# Patient Record
Sex: Male | Born: 1965 | Race: White | Hispanic: No | Marital: Single | Smoking: Never smoker
Health system: Southern US, Community
[De-identification: ages and names within clinical notes are randomized; demographics above are authoritative.]

## PROBLEM LIST (undated history)

## (undated) DIAGNOSIS — R7989 Other specified abnormal findings of blood chemistry: Secondary | ICD-10-CM

## (undated) HISTORY — DX: Other specified abnormal findings of blood chemistry: R79.89

## (undated) HISTORY — PX: HERNIA REPAIR: SHX51

---

## 2011-06-13 ENCOUNTER — Ambulatory Visit (INDEPENDENT_AMBULATORY_CARE_PROVIDER_SITE_OTHER): Payer: 59

## 2011-06-13 DIAGNOSIS — J111 Influenza due to unidentified influenza virus with other respiratory manifestations: Secondary | ICD-10-CM

## 2011-06-13 DIAGNOSIS — J209 Acute bronchitis, unspecified: Secondary | ICD-10-CM

## 2012-12-09 ENCOUNTER — Emergency Department (HOSPITAL_COMMUNITY): Payer: PRIVATE HEALTH INSURANCE

## 2012-12-09 ENCOUNTER — Encounter (HOSPITAL_COMMUNITY): Payer: Self-pay | Admitting: Emergency Medicine

## 2012-12-09 ENCOUNTER — Emergency Department (HOSPITAL_COMMUNITY)
Admission: EM | Admit: 2012-12-09 | Discharge: 2012-12-09 | Disposition: A | Discharge status: Home / Self Care | Payer: PRIVATE HEALTH INSURANCE | Attending: Emergency Medicine | Admitting: Emergency Medicine

## 2012-12-09 DIAGNOSIS — F17200 Nicotine dependence, unspecified, uncomplicated: Secondary | ICD-10-CM | POA: Insufficient documentation

## 2012-12-09 DIAGNOSIS — S0100XA Unspecified open wound of scalp, initial encounter: Secondary | ICD-10-CM | POA: Insufficient documentation

## 2012-12-09 DIAGNOSIS — F1010 Alcohol abuse, uncomplicated: Secondary | ICD-10-CM | POA: Insufficient documentation

## 2012-12-09 DIAGNOSIS — S0101XA Laceration without foreign body of scalp, initial encounter: Secondary | ICD-10-CM

## 2012-12-09 DIAGNOSIS — W1809XA Striking against other object with subsequent fall, initial encounter: Secondary | ICD-10-CM | POA: Insufficient documentation

## 2012-12-09 DIAGNOSIS — Y929 Unspecified place or not applicable: Secondary | ICD-10-CM | POA: Insufficient documentation

## 2012-12-09 DIAGNOSIS — Y939 Activity, unspecified: Secondary | ICD-10-CM | POA: Insufficient documentation

## 2012-12-09 DIAGNOSIS — F10920 Alcohol use, unspecified with intoxication, uncomplicated: Secondary | ICD-10-CM

## 2012-12-09 MED ORDER — HYDROCODONE-ACETAMINOPHEN 5-325 MG PO TABS: 1.0000 | ORAL_TABLET | ORAL | Status: DC | PRN

## 2012-12-09 MED ORDER — ONDANSETRON 8 MG PO TBDP
8.0000 mg | ORAL_TABLET | Freq: Once | ORAL | Status: AC
  Administered 2012-12-09: 8 mg via ORAL
  Filled 2012-12-09: qty 1

## 2012-12-09 MED ORDER — HYDROCODONE-ACETAMINOPHEN 5-325 MG PO TABS
2.0000 | ORAL_TABLET | Freq: Once | ORAL | Status: AC
  Administered 2012-12-09: 2 via ORAL
  Filled 2012-12-09: qty 2

## 2012-12-09 MED ORDER — ONDANSETRON 4 MG PO TBDP: ORAL_TABLET | ORAL | Status: DC

## 2012-12-09 NOTE — ED Notes (Signed)
Patient transported to CT 

## 2012-12-09 NOTE — ED Notes (Signed)
ZOX:[id number]<AV> Expected date:12/09/12<BR> Expected time: 8:50 PM<BR> Means of arrival:Ambulance<BR> Comments:<BR> EMS

## 2012-12-09 NOTE — ED Notes (Signed)
Pt ambulated with stand by assist with Clinical research associate.  Ice pack to the back of the head

## 2012-12-09 NOTE — ED Provider Notes (Signed)
History    CSN: [id number] Arrival date & time 12/09/12  2054  First MD Initiated Contact with Patient 12/09/12 2055     Chief Complaint  Patient presents with  . Fall  . Head Injury   (Consider location/radiation/quality/duration/timing/severity/associated sxs/prior Treatment) Patient is a 47 y.o. male presenting with fall and head injury. The history is provided by the patient, a friend and medical records. No language interpreter was used.  Fall Pertinent negatives include no abdominal pain, chest pain, coughing, diaphoresis, fatigue, fever, headaches, nausea, rash or vomiting.  Head Injury Associated symptoms: no headaches, no nausea and no vomiting     EUGENE ISADORE is a 47 y.o. male  with no medical Hx presents to the Emergency Department complaining of acute persistent laceration to the occipital portion of his scalp approximately 30 minutes prior to arrival. Laceration was sustained after a fall after much alcohol intake.  Friend the patient states he witnessed the event and states the patient struck his head on a door frame. He states there was no loss of consciousness and patient was ambulatory immediately after the event.  Patient denies pain including headache neck pain or back pain however patient is significantly intoxicated.  Patient denies headache, neck pain, back pain, weakness, numbness, tingling, loss of bowel or bladder function, loss of consciousness, of sensation.  History reviewed. No pertinent past medical history. History reviewed. No pertinent past surgical history. No family history on file. History  Substance Use Topics  . Smoking status: Current Every Day Smoker  . Smokeless tobacco: Not on file  . Alcohol Use: Yes    Review of Systems  Constitutional: Negative for fever, diaphoresis, appetite change, fatigue and unexpected weight change.  HENT: Negative for mouth sores and neck stiffness.   Eyes: Negative for visual disturbance.  Respiratory:  Negative for cough, chest tightness, shortness of breath and wheezing.   Cardiovascular: Negative for chest pain.  Gastrointestinal: Negative for nausea, vomiting, abdominal pain, diarrhea and constipation.  Genitourinary: Negative for dysuria, urgency, frequency and hematuria.  Musculoskeletal: Negative for back pain.  Skin: Positive for wound. Negative for rash.  Allergic/Immunologic: Negative for immunocompromised state.  Neurological: Negative for syncope, light-headedness and headaches.  Hematological: Does not bruise/bleed easily.  Psychiatric/Behavioral: Negative for sleep disturbance. The patient is not nervous/anxious.     Allergies  Review of patient's allergies indicates no known allergies.  Home Medications   Current Outpatient Rx  Name  Route  Sig  Dispense  Refill  . HYDROcodone-acetaminophen (NORCO/VICODIN) 5-325 MG per tablet   Oral   Take 1-2 tablets by mouth every 4 (four) hours as needed for pain.   15 tablet   0   . ondansetron (ZOFRAN ODT) 4 MG disintegrating tablet      4mg  ODT q4 hours prn nausea/vomit   10 tablet   0    BP 117/74  Pulse 74  Temp(Src) 97.8 F (36.6 C) (Oral)  Resp 20  SpO2 97% Physical Exam  Nursing note and vitals reviewed. Constitutional: He is oriented to person, place, and time. He appears well-developed and well-nourished. No distress.  HENT:  Head: Normocephalic. Head is with laceration.  Right Ear: Tympanic membrane, external ear and ear canal normal.  Left Ear: Tympanic membrane, external ear and ear canal normal.  Nose: No mucosal edema or rhinorrhea.  Mouth/Throat: Uvula is midline, oropharynx is clear and moist and mucous membranes are normal. Mucous membranes are not dry. No edematous. No oropharyngeal exudate, posterior oropharyngeal edema, posterior  oropharyngeal erythema or tonsillar abscesses.  No broken teeth, no oral lesions Large 20cm "U" shaped laceration to the occiput  Eyes: Conjunctivae are normal.  Neck:  Normal range of motion, full passive range of motion without pain and phonation normal. Neck supple. No spinous process tenderness and no muscular tenderness present. No rigidity. Normal range of motion present.  Full ROM without pain  Cardiovascular: Normal rate, regular rhythm, normal heart sounds and intact distal pulses.   No murmur heard. Pulmonary/Chest: Effort normal and breath sounds normal. No stridor. No respiratory distress. He has no wheezes.  Abdominal: Soft. He exhibits no distension. There is no tenderness.  Musculoskeletal: Normal range of motion.  Full range of motion of the T-spine and L-spine No tenderness to palpation of the spinous processes of the T-spine or L-spine No tenderness to palpation of the paraspinous muscles of the L-spine  Lymphadenopathy:    He has no cervical adenopathy.  Neurological: He is alert and oriented to person, place, and time. He has normal reflexes. No cranial nerve deficit. He exhibits normal muscle tone. Coordination normal.  Speech is clear and goal oriented, follows commands Normal strength in upper and lower extremities bilaterally including dorsiflexion and plantar flexion, strong and equal grip strength Sensation normal to light and sharp touch Moves extremities without ataxia, coordination intact Normal gait; Normal balance  Skin: Skin is warm and dry. No rash noted. He is not diaphoretic. No erythema.  Psychiatric: He has a normal mood and affect.    ED Course  LACERATION REPAIR Date/Time: 12/09/2012 9:20 PM Performed by: Dierdre Forth Authorized by: Dierdre Forth Consent: Verbal consent obtained. Risks and benefits: risks, benefits and alternatives were discussed Consent given by: patient Patient understanding: patient states understanding of the procedure being performed Patient consent: the patient's understanding of the procedure matches consent given Procedure consent: procedure consent matches procedure  scheduled Relevant documents: relevant documents present and verified Site marked: the operative site was marked Imaging studies: imaging studies available Required items: required blood products, implants, devices, and special equipment available Patient identity confirmed: verbally with patient and arm band Time out: Immediately prior to procedure a "time out" was called to verify the correct patient, procedure, equipment, support staff and site/side marked as required. Body area: head/neck Location details: scalp Laceration length: 20 cm Foreign bodies: no foreign bodies Tendon involvement: none Nerve involvement: none Vascular damage: no Patient sedated: no Preparation: Patient was prepped and draped in the usual sterile fashion. Irrigation solution: saline Irrigation method: syringe Amount of cleaning: extensive Debridement: none Degree of undermining: none Skin closure: staples Number of sutures: 16 Approximation: close Approximation difficulty: complex Patient tolerance: Patient tolerated the procedure well with no immediate complications.   (including critical care time) Labs Reviewed - No data to display Ct Head Wo Contrast  12/09/2012   *RADIOLOGY REPORT*  Clinical Data:  Large posterior head laceration following a fall.  CT HEAD WITHOUT CONTRAST CT CERVICAL SPINE WITHOUT CONTRAST  Technique:  Multidetector CT imaging of the head and cervical spine was performed following the standard protocol without intravenous contrast.  Multiplanar CT image reconstructions of the cervical spine were also generated.  Comparison:   None  CT HEAD  Findings: Right lateral and posterolateral scalp laceration.  No skull fracture, intracranial hemorrhage or paranasal sinus air- fluid levels.  Normal appearing cerebral hemispheres and posterior fossa structures.  The ventricles are normal in size and position.  IMPRESSION: Right-sided laceration without skull fracture or intracranial hemorrhage.   CT CERVICAL  SPINE  Findings: Reversal of the normal cervical lordosis.  Degenerative changes at multiple levels.  No prevertebral soft tissue swelling, fractures or subluxations.  IMPRESSION:  1.  No fracture or subluxation. 2.  Reversal of the normal cervical lordosis. 3.  Multilevel degenerative changes.   Original Report Authenticated By: Beckie Salts, M.D.   Ct Cervical Spine Wo Contrast  12/09/2012   *RADIOLOGY REPORT*  Clinical Data:  Large posterior head laceration following a fall.  CT HEAD WITHOUT CONTRAST CT CERVICAL SPINE WITHOUT CONTRAST  Technique:  Multidetector CT imaging of the head and cervical spine was performed following the standard protocol without intravenous contrast.  Multiplanar CT image reconstructions of the cervical spine were also generated.  Comparison:   None  CT HEAD  Findings: Right lateral and posterolateral scalp laceration.  No skull fracture, intracranial hemorrhage or paranasal sinus air- fluid levels.  Normal appearing cerebral hemispheres and posterior fossa structures.  The ventricles are normal in size and position.  IMPRESSION: Right-sided laceration without skull fracture or intracranial hemorrhage.  CT CERVICAL SPINE  Findings: Reversal of the normal cervical lordosis.  Degenerative changes at multiple levels.  No prevertebral soft tissue swelling, fractures or subluxations.  IMPRESSION:  1.  No fracture or subluxation. 2.  Reversal of the normal cervical lordosis. 3.  Multilevel degenerative changes.   Original Report Authenticated By: Beckie Salts, M.D.   1. Alcohol intoxication, episodic, uncomplicated   2. Scalp laceration, initial encounter     MDM  Harless Nakayama presents for laceration after fall 2/2 intoxication.  Tdap booster UTD within 1 year.  Pressure irrigation performed. Laceration occurred < 8 hours prior to repair which was well tolerated. Pt has no co morbidities to effect normal wound healing. Discussed suture home care w pt and answered  questions. Pt to f-u for wound check and suture removal in 7 days. Pt is hemodynamically stable w no complaints prior to dc.  I have also discussed reasons to return immediately to the ER.  Patient expresses understanding and agrees with plan.     Dahlia Client Falisa Lamora, PA-C 12/09/12 2307

## 2012-12-09 NOTE — ED Notes (Addendum)
PER EMS- [place]H and fall.  Denies LOC or nausea.  Arrived to ED on LSD and c-collar.   Possible superficial head lac to the r side back of head.  Alert and oriented x4.  No deformities noted.

## 2012-12-09 NOTE — ED Provider Notes (Signed)
Medical screening examination/treatment/procedure(s) were performed by non-physician practitioner and as supervising physician I was immediately available for consultation/collaboration.   Benny Lennert, MD 12/09/12 2329

## 2012-12-15 ENCOUNTER — Emergency Department (INDEPENDENT_AMBULATORY_CARE_PROVIDER_SITE_OTHER)
Admission: EM | Admit: 2012-12-15 | Discharge: 2012-12-15 | Disposition: A | Discharge status: Home / Self Care | Payer: PRIVATE HEALTH INSURANCE | Source: Home / Self Care

## 2012-12-15 ENCOUNTER — Encounter (HOSPITAL_COMMUNITY): Payer: Self-pay | Admitting: *Deleted

## 2012-12-15 DIAGNOSIS — Z5189 Encounter for other specified aftercare: Secondary | ICD-10-CM

## 2012-12-15 DIAGNOSIS — S0100XA Unspecified open wound of scalp, initial encounter: Secondary | ICD-10-CM

## 2012-12-15 NOTE — ED Notes (Signed)
Well  Healing  Staple  Line           Here  To  Have  Them  Removed

## 2012-12-15 NOTE — ED Provider Notes (Signed)
Medical screening examination/treatment/procedure(s) were performed by a resident physician or non-physician practitioner and as the supervising physician I was immediately available for consultation/collaboration.  Clementeen Graham, MD   Rodolph Bong, MD 12/15/12 [phone]

## 2012-12-15 NOTE — ED Provider Notes (Signed)
   History    CSN: [id number] Arrival date & time 12/15/12  4098  First MD Initiated Contact with Patient 12/15/12 [phone]     Chief Complaint  Patient presents with  . Suture / Staple Removal   (Consider location/radiation/quality/duration/timing/severity/associated sxs/prior Treatment) HPI  47 yo wm come in today for scalp wound check and possible staple removal.  [place] [place] a [place] causing the scalp injury.  Advise to come in for staple removal.  No bleeding or drainage.  States that he is going on vacation today to the beach and had planned to do some swimming.   History reviewed. No pertinent past medical history. History reviewed. No pertinent past surgical history. History reviewed. No pertinent family history. History  Substance Use Topics  . Smoking status: Current Every Day Smoker  . Smokeless tobacco: Not on file  . Alcohol Use: Yes    Review of Systems  Constitutional: Negative.   HENT: Negative.   Eyes: Negative.   Neurological: Negative.     Allergies  Review of patient's allergies indicates no known allergies.  Home Medications   Current Outpatient Rx  Name  Route  Sig  Dispense  Refill  . HYDROcodone-acetaminophen (NORCO/VICODIN) 5-325 MG per tablet   Oral   Take 1-2 tablets by mouth every 4 (four) hours as needed for pain.   15 tablet   0   . ondansetron (ZOFRAN ODT) 4 MG disintegrating tablet      4mg  ODT q4 hours prn nausea/vomit   10 tablet   0    BP 135/74  Pulse 78  Temp(Src) 98.4 F (36.9 C) (Oral)  Resp 16  SpO2 100% Physical Exam  Constitutional: He is oriented to person, place, and time. He appears well-developed and well-nourished.  HENT:  Head: Normocephalic and atraumatic.  Scalp staples intact.  No drainage or signs of infection.  Some tenderness.  Min swelling.    Eyes: EOM are normal. Pupils are equal, round, and reactive to light.  Neck: Normal range of motion.  Pulmonary/Chest: Effort normal.   Neurological: He is alert and oriented to person, place, and time.  Skin: Skin is dry.  Psychiatric: He has a normal mood and affect.    ED Course  Procedures (including critical care time) Labs Reviewed - No data to display No results found. 1. Visit for wound check     MDM  Will f/u next week for wound check and possible staple removal.  Advised that it is too early at this point due to the extent of his injury.  Will not swim.  All questions answered and understands the risk of infection and poor wound healing.    Zonia Kief, PA-C 12/15/12 [phone]

## 2012-12-15 NOTE — ED Notes (Signed)
Pt  Here  For  Staple  Removal         Appears  Well  Healing

## 2012-12-20 ENCOUNTER — Encounter (HOSPITAL_COMMUNITY): Payer: Self-pay

## 2012-12-20 ENCOUNTER — Emergency Department (HOSPITAL_COMMUNITY)
Admission: EM | Admit: 2012-12-20 | Discharge: 2012-12-20 | Disposition: A | Discharge status: Home / Self Care | Payer: PRIVATE HEALTH INSURANCE | Source: Home / Self Care | Attending: Emergency Medicine | Admitting: Emergency Medicine

## 2012-12-20 DIAGNOSIS — Z4802 Encounter for removal of sutures: Secondary | ICD-10-CM

## 2012-12-20 NOTE — ED Provider Notes (Signed)
Medical screening examination/treatment/procedure(s) were performed by non-physician practitioner and as supervising physician I was immediately available for consultation/collaboration.  Leslee Home, M.D.  Reuben Likes, MD 12/20/12 1146

## 2012-12-20 NOTE — ED Notes (Signed)
All 16 staples removed and accounted for

## 2012-12-20 NOTE — ED Notes (Signed)
Here for wound check; 16 staples intact, wound scabbing; area checked,

## 2012-12-20 NOTE — ED Provider Notes (Signed)
   History    CSN: [id number] Arrival date & time 12/20/12  2841  First MD Initiated Contact with Patient 12/20/12 [phone]     No chief complaint on file.  (Consider location/radiation/quality/duration/timing/severity/associated sxs/prior Treatment) HPI Comments: Wound is healing nicely , staples intact, no signs of infection  No past medical history on file. No past surgical history on file. No family history on file. History  Substance Use Topics  . Smoking status: Current Every Day Smoker  . Smokeless tobacco: Not on file  . Alcohol Use: Yes    Review of Systems  All other systems reviewed and are negative.    Allergies  Review of patient's allergies indicates no known allergies.  Home Medications   Current Outpatient Rx  Name  Route  Sig  Dispense  Refill  . HYDROcodone-acetaminophen (NORCO/VICODIN) 5-325 MG per tablet   Oral   Take 1-2 tablets by mouth every 4 (four) hours as needed for pain.   15 tablet   0   . ondansetron (ZOFRAN ODT) 4 MG disintegrating tablet      4mg  ODT q4 hours prn nausea/vomit   10 tablet   0    BP 124/83  Pulse 85  Temp(Src) 98.7 F (37.1 C) (Oral)  Resp 16  SpO2 100% Physical Exam  Nursing note and vitals reviewed. Constitutional: He is oriented to person, place, and time. He appears well-developed and well-nourished. No distress.  Neurological: He is alert and oriented to person, place, and time. He exhibits normal muscle tone.  Skin: Skin is warm and dry. No erythema.  Psychiatric: He has a normal mood and affect.    ED Course  Procedures (including critical care time) Labs Reviewed - No data to display No results found. 1. Encounter for staple removal     MDM  All 16 staples removed from the scalp by nurse Harriett Sine, RN  Hayden Rasmussen, NP 12/20/12 0102  Hayden Rasmussen, NP 12/20/12 0945

## 2018-02-25 ENCOUNTER — Ambulatory Visit (HOSPITAL_COMMUNITY)
Admission: EM | Admit: 2018-02-25 | Discharge: 2018-02-25 | Disposition: A | Discharge status: Home / Self Care | Payer: 59 | Attending: Family Medicine | Admitting: Family Medicine

## 2018-02-25 ENCOUNTER — Encounter (HOSPITAL_COMMUNITY): Payer: Self-pay

## 2018-02-25 ENCOUNTER — Other Ambulatory Visit: Payer: Self-pay

## 2018-02-25 DIAGNOSIS — R300 Dysuria: Secondary | ICD-10-CM | POA: Diagnosis not present

## 2018-02-25 DIAGNOSIS — F17200 Nicotine dependence, unspecified, uncomplicated: Secondary | ICD-10-CM | POA: Insufficient documentation

## 2018-02-25 DIAGNOSIS — N410 Acute prostatitis: Secondary | ICD-10-CM | POA: Insufficient documentation

## 2018-02-25 DIAGNOSIS — N4889 Other specified disorders of penis: Secondary | ICD-10-CM | POA: Diagnosis not present

## 2018-02-25 LAB — POCT URINALYSIS DIP (DEVICE)
BILIRUBIN URINE: NEGATIVE
Glucose, UA: NEGATIVE mg/dL
HGB URINE DIPSTICK: NEGATIVE
Ketones, ur: 15 mg/dL — AB
Leukocytes, UA: NEGATIVE
NITRITE: NEGATIVE
Protein, ur: NEGATIVE mg/dL
Specific Gravity, Urine: 1.01 (ref 1.005–1.030)
Urobilinogen, UA: 0.2 mg/dL (ref 0.0–1.0)
pH: 6.5 (ref 5.0–8.0)

## 2018-02-25 MED ORDER — CIPROFLOXACIN HCL 500 MG PO TABS: 500.0000 mg | ORAL_TABLET | Freq: Two times a day (BID) | ORAL | 0 refills | Status: AC

## 2018-02-25 NOTE — ED Triage Notes (Signed)
Pt presents today with dysuria that started a week ago. States he does not have any other sxs except the tip of his penis burns with urination. Never had this problem before.

## 2018-02-25 NOTE — Discharge Instructions (Addendum)
Rest and push fluids Urine culture and cytology sent.  We will follow up with you regarding abnormal results Ciprofloxacin prescribed.  Take twice daily for 6 weeks. Take as directed and to completion.   Follow up with PCP for further evaluation and management Return or go to the ED if you have any new or worsening symptoms including fever, chills, nausea, vomiting, worsening pain, inability to pass urine, etc..Marland Kitchen

## 2018-02-25 NOTE — ED Provider Notes (Signed)
[facility]   CC: Dysuria  SUBJECTIVE:  Matthew Rosario is a 52 y.o. male who complains of burning at the tip of his penis with urination, and urinary hesitancy  for the past week.  Patient denies a precipitating event.  Sexually active with 1 male partner in monogamous relationship.  Denies abdominal or flank pain.  Has tried OTC medications without relief.  Symptoms are made worse with urination.  Denies similar symptoms in the past. Patient states he is circumcised.  Denies fever, chills, nausea, vomiting, fatigue, myalgias, abdominal pain, flank pain, abnormal penile discharge, testicular pain or swelling, decreased flow, or hematuria.    LMP: No LMP for male patient.  ROS: As in HPI.  History reviewed. No pertinent past medical history. History reviewed. No pertinent surgical history. No Known Allergies No current facility-administered medications on file prior to encounter.    No current outpatient medications on file prior to encounter.   Social History   Socioeconomic History  . Marital status: Single    Spouse name: Not on file  . Number of children: Not on file  . Years of education: Not on file  . Highest education level: Not on file  Occupational History  . Not on file  Social Needs  . Financial resource strain: Not on file  . Food insecurity:    Worry: Not on file    Inability: Not on file  . Transportation needs:    Medical: Not on file    Non-medical: Not on file  Tobacco Use  . Smoking status: Current Every Day Smoker  . Smokeless tobacco: Never Used  Substance and Sexual Activity  . Alcohol use: Yes  . Drug use: No  . Sexual activity: Not on file  Lifestyle  . Physical activity:    Days per week: Not on file    Minutes per session: Not on file  . Stress: Not on file  Relationships  . Social connections:    Talks on phone: Not on file    Gets together: Not on file    Attends religious service: Not on file    Active member of club  or organization: Not on file    Attends meetings of clubs or organizations: Not on file    Relationship status: Not on file  . Intimate partner violence:    Fear of current or ex partner: Not on file    Emotionally abused: Not on file    Physically abused: Not on file    Forced sexual activity: Not on file  Other Topics Concern  . Not on file  Social History Narrative  . Not on file   No family history on file.  OBJECTIVE:  Vitals:   02/25/18 1022  BP: 130/82  Pulse: 95  Resp: 16  Temp: 98 F (36.7 C)  TempSrc: Oral  SpO2: 100%   General appearance: AOx3 in no acute distress; nontoxic appearance HEENT: NCAT.  Oropharynx clear.  Lungs: clear to auscultation bilaterally without adventitious breath sounds Heart: regular rate and rhythm.  Radial pulses 2+ symmetrical bilaterally Abdomen: soft; non-distended; no tenderness; bowel sounds present; no masses or organomegaly; no guarding or rebound tenderness Back: no CVA tenderness Rectal: Declines chaperone: External exam negative for external hemorrhoids, no obvious masses or fissures appreciated.  Internal: Prostate mildly tender to palpation Extremities: no edema; symmetrical with no gross deformities Skin: warm and dry Neurologic: Ambulates from chair to exam table without difficulty Psychological: alert and cooperative; normal mood and affect  Labs Reviewed  POCT URINALYSIS DIP (DEVICE) - Abnormal; Notable for the following components:      Result Value   Ketones, ur 15 (*)    All other components within normal limits  URINE CULTURE  URINE CYTOLOGY ANCILLARY ONLY    ASSESSMENT & PLAN:  1. Acute prostatitis     Meds ordered this encounter  Medications  . ciprofloxacin (CIPRO) 500 MG tablet    Sig: Take 1 tablet (500 mg total) by mouth 2 (two) times daily.    Dispense:  84 tablet    Refill:  0    Order Specific Question:   Supervising Provider    Answer:   Isa Rankin [[id number]]    Rest and push  fluids Urine culture and cytology sent.  We will follow up with you regarding abnormal results Ciprofloxacin prescribed.  Take twice daily for 6 weeks. Take as directed and to completion.   Follow up with PCP for further evaluation and management Return or go to the ED if you have any new or worsening symptoms including fever, chills, nausea, vomiting, worsening pain, inability to pass urine, etc...  Outlined signs and symptoms indicating need for more acute intervention. Patient verbalized understanding. After Visit Summary given.     Rennis Harding, PA-C 02/25/18 1128

## 2018-02-26 LAB — URINE CULTURE: CULTURE: NO GROWTH

## 2018-02-27 LAB — URINE CYTOLOGY ANCILLARY ONLY
CHLAMYDIA, DNA PROBE: NEGATIVE
Neisseria Gonorrhea: NEGATIVE

## 2018-12-11 ENCOUNTER — Other Ambulatory Visit: Payer: Self-pay

## 2018-12-11 DIAGNOSIS — Z20822 Contact with and (suspected) exposure to covid-19: Secondary | ICD-10-CM

## 2018-12-15 LAB — NOVEL CORONAVIRUS, NAA: SARS-CoV-2, NAA: NOT DETECTED

## 2019-08-17 ENCOUNTER — Ambulatory Visit: Payer: Self-pay | Attending: Internal Medicine

## 2019-08-17 DIAGNOSIS — Z23 Encounter for immunization: Secondary | ICD-10-CM

## 2019-08-17 NOTE — Progress Notes (Signed)
   Covid-19 Vaccination Clinic  Name:  [name]    MRN: [id number] DOB: 29-Oct-1965  08/17/2019  [name] was observed post Covid-19 immunization for 15 minutes without incident. He was provided with Vaccine Information Sheet and instruction to access the V-Safe system.   [name] was instructed to call 911 with any severe reactions post vaccine: Marland Kitchen Difficulty breathing  . Swelling of face and throat  . A fast heartbeat  . A bad rash all over body  . Dizziness and weakness   Immunizations Administered    Name Date Dose VIS Date Route   Pfizer COVID-19 Vaccine 08/17/2019  2:04 PM 0.3 mL 05/11/2019 Intramuscular   Manufacturer: [employer], [employer]   Lot: [id number]   NDC: [address]-4

## 2019-09-12 ENCOUNTER — Ambulatory Visit: Payer: Self-pay | Attending: Internal Medicine

## 2019-09-12 DIAGNOSIS — Z23 Encounter for immunization: Secondary | ICD-10-CM

## 2019-09-12 NOTE — Progress Notes (Signed)
   Covid-19 Vaccination Clinic  Name:  Harvard Zeiss    MRN: [id number] DOB: [birth year]  09/12/2019  Mr. Hightower was observed post Covid-19 immunization for 15 minutes without incident. He was provided with Vaccine Information Sheet and instruction to access the V-Safe system.   Mr. Keltz was instructed to call 911 with any severe reactions post vaccine: Marland Kitchen Difficulty breathing  . Swelling of face and throat  . A fast heartbeat  . A bad rash all over body  . Dizziness and weakness   Immunizations Administered    Name Date Dose VIS Date Route   Pfizer COVID-19 Vaccine 09/12/2019  2:53 PM 0.3 mL 05/11/2019 Intramuscular   Manufacturer: [employer], [employer]   Lot: [id number]   NDC: [address]-4

## 2019-10-18 ENCOUNTER — Ambulatory Visit: Payer: Self-pay | Attending: Internal Medicine

## 2019-10-18 ENCOUNTER — Other Ambulatory Visit: Payer: Self-pay

## 2019-10-18 DIAGNOSIS — Z20822 Contact with and (suspected) exposure to covid-19: Secondary | ICD-10-CM | POA: Insufficient documentation

## 2019-10-19 LAB — NOVEL CORONAVIRUS, NAA: SARS-CoV-2, NAA: NOT DETECTED

## 2019-10-19 LAB — SARS-COV-2, NAA 2 DAY TAT

## 2020-10-08 ENCOUNTER — Other Ambulatory Visit: Payer: Self-pay

## 2020-10-08 ENCOUNTER — Ambulatory Visit (HOSPITAL_COMMUNITY)
Admission: EM | Admit: 2020-10-08 | Discharge: 2020-10-08 | Disposition: A | Discharge status: Home / Self Care | Payer: 59 | Attending: Physician Assistant | Admitting: Physician Assistant

## 2020-10-08 ENCOUNTER — Encounter (HOSPITAL_COMMUNITY): Payer: Self-pay | Admitting: Emergency Medicine

## 2020-10-08 DIAGNOSIS — M79604 Pain in right leg: Secondary | ICD-10-CM

## 2020-10-08 DIAGNOSIS — M79605 Pain in left leg: Secondary | ICD-10-CM

## 2020-10-08 DIAGNOSIS — I739 Peripheral vascular disease, unspecified: Secondary | ICD-10-CM | POA: Diagnosis not present

## 2020-10-08 MED ORDER — IBUPROFEN 600 MG PO TABS: 600.0000 mg | ORAL_TABLET | Freq: Three times a day (TID) | ORAL | 0 refills | Status: DC | PRN

## 2020-10-08 MED ORDER — METHOCARBAMOL 500 MG PO TABS: 500.0000 mg | ORAL_TABLET | Freq: Three times a day (TID) | ORAL | 0 refills | Status: DC | PRN

## 2020-10-08 NOTE — Discharge Instructions (Signed)
Take Robaxin (methocarbamol) up to 3 times a day as needed.  You should not drive or drink alcohol taking this medication as drowsiness is a common side effect.  Take ibuprofen 600 mg up to 3 times a day as needed.  You should not take additional NSAIDs with this medication due to risk of GI bleeding (such as aspirin, ibuprofen/Advil, naproxen/Aleve).  We will be in touch with your blood work if anything is abnormal.  Continue with stretch make sure he wears supportive footwear.  As discussed, if symptoms or not improving I would recommend following up with vascular surgeon to rule out peripheral artery disease.  I would also recommend seeing her PCP to consider physical therapy referral.  Please return if anything worsens.

## 2020-10-08 NOTE — ED Provider Notes (Signed)
[facility]    CSN: [id number] Arrival date & time: 10/08/20  1007      History   Chief Complaint Chief Complaint  Patient presents with  . Leg Pain    HPI Matthew Rosario is a 55 y.o. male.   Patient presents today with a several month history of intermittent lower extremity pain.  Reports pain is worse after he has been walking for a while and increases with continued activity and improves with rest.  States symptoms typically began after he is walked approximately 2 blocks.  He denies any changes to footwear.  Denies episodes of similar symptoms in the past.  During episodes pain is rated 2/3 on a 0-10 pain scale, described as aching, localized to lower leg with radiation into gastrocnemius, improved with rest.  He has not tried any over-the-counter medications for symptom management.  He denies history of peripheral artery disease or significant risk factors including hypertension, hyperlipidemia, smoking, diabetes.  He does not know much about his family history as he was adopted.  He does report decreased activity during the pandemic and is responded to increasing his activity level prior to symptom onset.  Symptoms are interfering with ability perform daily activities.     History reviewed. No pertinent past medical history.  There are no problems to display for this patient.   History reviewed. No pertinent surgical history.     Home Medications    Prior to Admission medications   Medication Sig Start Date End Date Taking? Authorizing Provider  ibuprofen (ADVIL) 600 MG tablet Take 1 tablet (600 mg total) by mouth every 8 (eight) hours as needed. 10/08/20  Yes Fredi Geiler K, PA-C  methocarbamol (ROBAXIN) 500 MG tablet Take 1 tablet (500 mg total) by mouth every 8 (eight) hours as needed for muscle spasms. 10/08/20  Yes Kinslee Dalpe, Noberto Retort, PA-C    Family History History reviewed. No pertinent family history.  Social History Social History   Tobacco Use   . Smoking status: Current Every Day Smoker  . Smokeless tobacco: Never Used  Substance Use Topics  . Alcohol use: Yes  . Drug use: No     Allergies   Patient has no known allergies.   Review of Systems Review of Systems  Constitutional: Positive for activity change. Negative for appetite change, fatigue and fever.  Respiratory: Negative for cough and shortness of breath.   Cardiovascular: Negative for chest pain.  Gastrointestinal: Negative for abdominal pain, diarrhea, nausea and vomiting.  Musculoskeletal: Positive for gait problem and myalgias. Negative for arthralgias and joint swelling.  Neurological: Negative for dizziness, weakness, light-headedness, numbness and headaches.     Physical Exam Triage Vital Signs ED Triage Vitals  Enc Vitals Group     BP 10/08/20 1051 (!) 127/96     Pulse Rate 10/08/20 1051 (!) 109     Resp 10/08/20 1051 18     Temp 10/08/20 1051 98.7 F (37.1 C)     Temp Source 10/08/20 1051 Oral     SpO2 10/08/20 1051 99 %     Weight --      Height --      Head Circumference --      Peak Flow --      Pain Score 10/08/20 1049 0     Pain Loc --      Pain Edu? --      Excl. in GC? --    No data found.  Updated Vital Signs BP (!) 127/96 (  BP Location: Right Arm)   Pulse (!) 109   Temp 98.7 F (37.1 C) (Oral)   Resp 18   SpO2 99%   Visual Acuity Right Eye Distance:   Left Eye Distance:   Bilateral Distance:    Right Eye Near:   Left Eye Near:    Bilateral Near:     Physical Exam Vitals reviewed.  Constitutional:      General: He is awake.     Appearance: Normal appearance. He is normal weight. He is not ill-appearing.     Comments: Very pleasant male appears stated age in no acute distress  HENT:     Head: Normocephalic and atraumatic.  Cardiovascular:     Rate and Rhythm: Normal rate and regular rhythm.     Pulses:          Posterior tibial pulses are 2+ on the right side and 2+ on the left side.     Heart sounds: No murmur  heard.     Comments: No dependent rubor on exam.  Both calves measure 37 cm bilaterally Pulmonary:     Effort: Pulmonary effort is normal.     Breath sounds: Normal breath sounds. No stridor. No wheezing, rhonchi or rales.     Comments: Clear to auscultation bilaterally Abdominal:     General: Bowel sounds are normal.     Palpations: Abdomen is soft.     Tenderness: There is no abdominal tenderness.  Musculoskeletal:     Right lower leg: No tenderness or bony tenderness. No edema.     Left lower leg: No tenderness or bony tenderness. No edema.  Neurological:     Mental Status: He is alert.  Psychiatric:        Behavior: Behavior is cooperative.      UC Treatments / Results  Labs (all labs ordered are listed, but only abnormal results are displayed) Labs Reviewed  CBC WITH DIFFERENTIAL/PLATELET  COMPREHENSIVE METABOLIC PANEL  CK    EKG   Radiology No results found.  Procedures Procedures (including critical care time)  Medications Ordered in UC Medications - No data to display  Initial Impression / Assessment and Plan / UC Course  I have reviewed the triage vital signs and the nursing notes.  Pertinent labs & imaging results that were available during my care of the patient were reviewed by me and considered in my medical decision making (see chart for details).     Suspect symptoms are related to muscle tension from inactivity.  Patient was started on methocarbamol and ibuprofen with instruction not to drive or drink alcohol with methocarbamol and not to take additional NSAIDs with ibuprofen.  Encouraged him to wear supportive footwear and stretch.  Discussed potential utility of seeing physical therapy should symptoms persist.  Discussed symptoms are consistent with claudication if they do not improve with medications provided will be worthwhile to see vascular surgeon for ABIs.  He was provided contact information for vascular surgeon should symptoms persist and  encouraged to schedule an appointment.  Strict return precautions given to which patient expressed understanding.  Final Clinical Impressions(s) / UC Diagnoses   Final diagnoses:  Bilateral leg pain  Claudication of calf muscles (HCC)     Discharge Instructions     Take Robaxin (methocarbamol) up to 3 times a day as needed.  You should not drive or drink alcohol taking this medication as drowsiness is a common side effect.  Take ibuprofen 600 mg up to 3 times a day  as needed.  You should not take additional NSAIDs with this medication due to risk of GI bleeding (such as aspirin, ibuprofen/Advil, naproxen/Aleve).  We will be in touch with your blood work if anything is abnormal.  Continue with stretch make sure he wears supportive footwear.  As discussed, if symptoms or not improving I would recommend following up with vascular surgeon to rule out peripheral artery disease.  I would also recommend seeing her PCP to consider physical therapy referral.  Please return if anything worsens.    ED Prescriptions    Medication Sig Dispense Auth. Provider   ibuprofen (ADVIL) 600 MG tablet Take 1 tablet (600 mg total) by mouth every 8 (eight) hours as needed. 30 tablet Emanuelle Bastos K, PA-C   methocarbamol (ROBAXIN) 500 MG tablet Take 1 tablet (500 mg total) by mouth every 8 (eight) hours as needed for muscle spasms. 21 tablet Ruthie Berch, Noberto Retort, PA-C     PDMP not reviewed this encounter.   Jeani Hawking, PA-C 10/08/20 1231

## 2020-10-08 NOTE — ED Triage Notes (Signed)
Pt presents with bilateral lower leg pain and tightness xs 6 months. States has been occurring more often.

## 2020-10-08 NOTE — ED Notes (Signed)
PT refused lab draw

## 2020-10-13 ENCOUNTER — Other Ambulatory Visit: Payer: Self-pay

## 2020-10-13 DIAGNOSIS — I739 Peripheral vascular disease, unspecified: Secondary | ICD-10-CM

## 2020-10-21 ENCOUNTER — Encounter: Payer: Self-pay | Admitting: Vascular Surgery

## 2020-10-21 ENCOUNTER — Other Ambulatory Visit: Payer: Self-pay

## 2020-10-21 ENCOUNTER — Ambulatory Visit (HOSPITAL_COMMUNITY)
Admission: RE | Admit: 2020-10-21 | Discharge: 2020-10-21 | Disposition: A | Discharge status: Home / Self Care | Payer: 59 | Source: Ambulatory Visit | Attending: Vascular Surgery | Admitting: Vascular Surgery

## 2020-10-21 ENCOUNTER — Ambulatory Visit (INDEPENDENT_AMBULATORY_CARE_PROVIDER_SITE_OTHER): Payer: 59 | Admitting: Vascular Surgery

## 2020-10-21 DIAGNOSIS — I739 Peripheral vascular disease, unspecified: Secondary | ICD-10-CM

## 2020-10-21 DIAGNOSIS — M7660 Achilles tendinitis, unspecified leg: Secondary | ICD-10-CM | POA: Diagnosis not present

## 2020-10-21 NOTE — Progress Notes (Signed)
Patient name: Matthew Rosario MRN: [id number] DOB: 1965-12-10 Sex: male  REASON FOR CONSULT: Evaluate for bilateral leg pain and calf claudication  HPI: Matthew Rosario is a 55 y.o. male, with no significant past medical history that presents for evaluation of bilateral leg pain and possible calf claudication.  Patient states this all started in the fall 2021.  States whenever he walks several blocks he usually feels "tightness" in the tendon behind his ankle.  Both legs have equal symptoms.  States this is not really painful, just tight.  No symptoms in the calves themselves.  Apparently this usually gets better when he stretches.  He does not smoke.  He has had no other previous vascular interventions.  He was seen in urgent care and told this was likely vascular claudication and referred to our practice.  He is a Environmental education officer in town.  No history of lower extremity trauma.  History reviewed. No pertinent past medical history.  Past Surgical History:  Procedure Laterality Date  . HERNIA REPAIR      Family History  Adopted: Yes    SOCIAL HISTORY: Social History   Socioeconomic History  . Marital status: Single    Spouse name: Not on file  . Number of children: Not on file  . Years of education: Not on file  . Highest education level: Not on file  Occupational History  . Not on file  Tobacco Use  . Smoking status: Current Every Day Smoker  . Smokeless tobacco: Never Used  Substance and Sexual Activity  . Alcohol use: Yes  . Drug use: No  . Sexual activity: Not on file  Other Topics Concern  . Not on file  Social History Narrative  . Not on file   Social Determinants of Health   Financial Resource Strain: Not on file  Food Insecurity: Not on file  Transportation Needs: Not on file  Physical Activity: Not on file  Stress: Not on file  Social Connections: Not on file  Intimate Partner Violence: Not on file    No Known Allergies  Current Outpatient  Medications  Medication Sig Dispense Refill  . ibuprofen (ADVIL) 600 MG tablet Take 1 tablet (600 mg total) by mouth every 8 (eight) hours as needed. (Patient not taking: Reported on 10/21/2020) 30 tablet 0  . methocarbamol (ROBAXIN) 500 MG tablet Take 1 tablet (500 mg total) by mouth every 8 (eight) hours as needed for muscle spasms. (Patient not taking: Reported on 10/21/2020) 21 tablet 0   No current facility-administered medications for this visit.    REVIEW OF SYSTEMS:  [X]  denotes positive finding, [ ]  denotes negative finding Cardiac  Comments:  Chest pain or chest pressure:    Shortness of breath upon exertion:    Short of breath when lying flat:    Irregular heart rhythm:        Vascular    Pain in calf, thigh, or hip brought on by ambulation:    Pain in feet at night that wakes you up from your sleep:     Blood clot in your veins:    Leg swelling:         Pulmonary    Oxygen at home:    Productive cough:     Wheezing:         Neurologic    Sudden weakness in arms or legs:     Sudden numbness in arms or legs:     Sudden onset of difficulty speaking  or slurred speech:    Temporary loss of vision in one eye:     Problems with dizziness:         Gastrointestinal    Blood in stool:     Vomited blood:         Genitourinary    Burning when urinating:     Blood in urine:        Psychiatric    Major depression:         Hematologic    Bleeding problems:    Problems with blood clotting too easily:        Skin    Rashes or ulcers:        Constitutional    Fever or chills:      PHYSICAL EXAM: Vitals:   10/21/20 0843  BP: 128/89  Pulse: 91  Resp: 18  Temp: 97.9 F (36.6 C)  TempSrc: Temporal  SpO2: 95%  Weight: 161 lb (73 kg)  Height: 6' (1.829 m)    GENERAL: The patient is a well-nourished male, in no acute distress. The vital signs are documented above. CARDIAC: There is a regular rate and rhythm.  VASCULAR:  Palpable femoral pulses  bilaterally Palpable popliteal pulses bilaterally Palpable dorsalis pedis posterior tibial pulses bilaterally PULMONARY: No respiratory distress. ABDOMEN: Soft and non-tender. MUSCULOSKELETAL: There are no major deformities or cyanosis. NEUROLOGIC: No focal weakness or paresthesias are detected. SKIN: There are no ulcers or rashes noted. PSYCHIATRIC: The patient has a normal affect.  DATA:   ABIs today are 1.16 on the right triphasic and 1.10 on the left triphasic with no evidence of lower extremity arterial disease.  Assessment/Plan:  55 year old male with no medical history presents for evaluation of possible bilateral lower extremity calf claudication.  I discussed with him in detail that he has a normal ABI greater than 1 with a normal triphasic waveform at the ankle.  He has easily palpable dorsalis pedis and posterior tibial pulses bilaterally.  He is not having any significant pain in the calf and most of his pain appears to be associated with the Achilles tendon at the ankle that he describes as a "tightness."  I do not think his symptoms are consistent with vascular claudication as I discussed with him today. I will refer him to one of our foot and ankle surgeons in town Dr. Aldean Baker for further evaluation to see if he has other thoughts about the etiology for his pain.  Seems fairly focal over the achilles tendon bilaterally.  Discussed he can follow-up with me as needed.   Cephus Shelling, MD [facility] Office: [phone]

## 2020-10-28 ENCOUNTER — Ambulatory Visit (INDEPENDENT_AMBULATORY_CARE_PROVIDER_SITE_OTHER): Payer: 59 | Admitting: Orthopedic Surgery

## 2020-10-28 ENCOUNTER — Other Ambulatory Visit: Payer: Self-pay

## 2020-10-28 DIAGNOSIS — M6702 Short Achilles tendon (acquired), left ankle: Secondary | ICD-10-CM | POA: Diagnosis not present

## 2020-10-28 DIAGNOSIS — M6701 Short Achilles tendon (acquired), right ankle: Secondary | ICD-10-CM

## 2020-11-18 ENCOUNTER — Encounter: Payer: Self-pay | Admitting: Orthopedic Surgery

## 2020-11-18 NOTE — Progress Notes (Signed)
   Office Visit Note   Patient: Matthew Rosario           Date of Birth: February 26, 1966           MRN: [id number] Visit Date: 10/28/2020              Requested by: Cephus Shelling, MD [address] [place],  [place] [address] PCP: Patient, No Pcp Per (Inactive)  Chief Complaint  Patient presents with   Left Leg - Pain   Right Leg - Pain      HPI: Patient is a 55 year old gentleman who complains of bilateral Achilles tightness for 7 months worse after walking about 1-2 blocks.  Patient denies any specific injuries.  He states he was advised he had claudication and his symptoms resolved with Robaxin.  Patient has seen Dr. Imogene Burn years ago and was told he had good circulation.  Patient has no pain except with activities.  Assessment & Plan: Visit Diagnoses:  1. Achilles tendon contracture, bilateral     Plan: Patient was given instruction for Achilles stretching as well as fascial strengthening.  Follow-Up Instructions: No follow-ups on file.   Ortho Exam  Patient is alert, oriented, no adenopathy, well-dressed, normal affect, normal respiratory effort. Examination patient has a  Imaging: No results found. No images are attached to the encounter.  Labs: Dorsalis pedis and posterior tibial pulse bilaterally.  His anterior compartment is soft nontender to palpation no signs of compartment syndrome or claudication.  With his knee extended he has dorsiflexion 10 degrees short of neutral on the right and dorsiflexion to neutral on the left.  Patient does have weakness with posterior tibial tendon function without pain. Lab Results  Component Value Date   REPTSTATUS 02/26/2018 FINAL 02/25/2018   CULT  02/25/2018    NO GROWTH Performed at [facility], [address] [place], [place] [address]      No results found for: ALBUMIN, PREALBUMIN, CBC  No results found for: MG No results found for: VD25OH  No results found for: PREALBUMIN No flowsheet data found.   There  is no height or weight on file to calculate BMI.  Orders:  No orders of the defined types were placed in this encounter.  No orders of the defined types were placed in this encounter.    Procedures: No procedures performed  Clinical Data: No additional findings.  ROS:  All other systems negative, except as noted in the HPI. Review of Systems  Objective: Vital Signs: There were no vitals taken for this visit.  Specialty Comments:  No specialty comments available.  PMFS History: Patient Active Problem List   Diagnosis Date Noted   Achilles tendon pain 10/21/2020   History reviewed. No pertinent past medical history.  Family History  Adopted: Yes    Past Surgical History:  Procedure Laterality Date   HERNIA REPAIR     Social History   Occupational History   Not on file  Tobacco Use   Smoking status: Every Day    Pack years: 0.00   Smokeless tobacco: Never  Substance and Sexual Activity   Alcohol use: Yes   Drug use: No   Sexual activity: Not on file

## 2021-02-12 ENCOUNTER — Ambulatory Visit (INDEPENDENT_AMBULATORY_CARE_PROVIDER_SITE_OTHER): Payer: 59 | Admitting: Neurology

## 2021-02-12 ENCOUNTER — Encounter: Payer: Self-pay | Admitting: Neurology

## 2021-02-12 ENCOUNTER — Other Ambulatory Visit: Payer: Self-pay

## 2021-02-12 ENCOUNTER — Other Ambulatory Visit (INDEPENDENT_AMBULATORY_CARE_PROVIDER_SITE_OTHER): Payer: 59

## 2021-02-12 VITALS — BP 145/95 | HR 112 | Ht 72.0 in | Wt 147.0 lb

## 2021-02-12 DIAGNOSIS — G1220 Motor neuron disease, unspecified: Secondary | ICD-10-CM | POA: Diagnosis not present

## 2021-02-12 DIAGNOSIS — M62541 Muscle wasting and atrophy, not elsewhere classified, right hand: Secondary | ICD-10-CM | POA: Diagnosis not present

## 2021-02-12 DIAGNOSIS — M21371 Foot drop, right foot: Secondary | ICD-10-CM

## 2021-02-12 DIAGNOSIS — R292 Abnormal reflex: Secondary | ICD-10-CM | POA: Diagnosis not present

## 2021-02-12 LAB — SEDIMENTATION RATE: Sed Rate: 12 mm/hr (ref 0–20)

## 2021-02-12 LAB — B12 AND FOLATE PANEL
Folate: 11.2 ng/mL (ref >5.9)
Vitamin B-12: 443 pg/mL (ref 211–911)

## 2021-02-12 LAB — CK: Total CK: 473 U/L — ABNORMAL HIGH (ref 7–232)

## 2021-02-12 LAB — C-REACTIVE PROTEIN: CRP: <1 mg/dL (ref 0.5–20.0)

## 2021-02-12 NOTE — Progress Notes (Signed)
[facility] Clinic Note - Initial Visit   Date: 02/12/21  Matthew Rosario MRN: [id number] DOB: 1966-01-25   Dear Dr. Nancy Fetter:  Thank you for your kind referral of Matthew Rosario for consultation of generalized weakness. Although his history is well known to you, please allow [place] to reiterate it for the purpose of our medical record. The patient was accompanied to the clinic by self.    History of Present Illness: Matthew Rosario is a 55 y.o. right-handed male with no prior medical history presenting for evaluation of progressive weakness.   Starting in 2021, he began having weakness in the legs, which was noticeable as tightness in the calf muscles especially after walking.  Over the past year, he has noticed progressive weakness in the legs.  His evaluation for vascular claudication was negative.  He also saw orthopaedics who felt his tendon was weak in the right foot. He has been dragging his right foot for the past 4-5 months.  He also complains of right hand weakness and muscle loss. He denies pain in the arms or legs. Occasionally, he has cramps in the hands.  He has some weakness in his voice and has noticed that it is worse with prolonged talking.  No problems with swallowing.  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  He has two children which are healthy.    Past Medical History:  Diagnosis Date   Low testosterone     Past Surgical History:  Procedure Laterality Date   HERNIA REPAIR       Medications:  Outpatient Encounter Medications as of 02/12/2021  Medication Sig   [DISCONTINUED] ibuprofen (ADVIL) 600 MG tablet Take 1 tablet (600 mg total) by mouth every 8 (eight) hours as needed. (Patient not taking: No sig reported)   [DISCONTINUED] methocarbamol (ROBAXIN) 500 MG tablet Take 1 tablet (500 mg total) by mouth every 8 (eight) hours as needed for muscle spasms. (Patient not taking: No sig  reported)   No facility-administered encounter medications on file as of 02/12/2021.    Allergies: No Known Allergies  Family History: Family History  Adopted: Yes    Social History: Social History   Tobacco Use   Smoking status: Never   Smokeless tobacco: Never  Substance Use Topics   Alcohol use: Yes    Comment: 6-8 beers over the weekend   Drug use: No   Social History   Social History Narrative   Right Handed    Lives in a one story home    Patient is adopted and does not know any family medical history    Attorney (business low).      Vital Signs:  BP (!) 145/95   Pulse (!) 112   Ht 6' (1.829 m)   Wt 147 lb (66.7 kg)   SpO2 99%   BMI 19.94 kg/m    Neurological Exam: MENTAL STATUS including orientation to time, place, person, recent and remote memory, attention span and concentration, language, and fund of knowledge is normal.  Speech is slow and slightly soft, no dysarthria.  Lingual and guttural sounds intact.  CRANIAL NERVES: II:  No visual field defects.     III-IV-VI: Pupils equal round and reactive to light.  Normal conjugate, extra-ocular eye movements in all directions of gaze.  No nystagmus.  No ptosis.   V:  Normal facial sensation.    VII:  Normal facial symmetry and movements.  Myerson's sign  positive.  Snout and jaw jerk reflex is present.  Palmofacial reflex is absent. VIII:  Normal hearing and vestibular function.   IX-X:  Normal palatal movement.   XI:  Normal shoulder shrug and head rotation.   XII:  Side-to-side tongue moves are mildly slowed, rare tongue fasciculations are seen, strength is 5/5   MOTOR:  Severe right hand atrophy with split hand, mild atrophy on the right FDI and ABP.  Active fasciculations over the upper arms (left > right).  No pronator drift.   Upper Extremity:  Right  Left  Deltoid  5/5   5/5   Biceps  5/5   5/5   Triceps  5/5   5/5   Infraspinatus 5-/5  5-/5  Medial pectoralis 5-/5  5-/5  Wrist extensors  4/5    5-/5   Wrist flexors  4/5   5-/5   Finger extensors  4/5   4/5   Finger flexors  5-/5   5/5   Dorsal interossei  3/5   4/5   Abductor pollicis  1/5   4/5   Tone (Ashworth scale)  0  0   Lower Extremity:  Right  Left  Hip flexors  5-/5   5/5   Hip extensors  5/5   5/5   Adductor 5/5  5/5  Abductor 5/5  5/5  Knee flexors  5/5   5/5   Knee extensors  5-/5   5/5   Dorsiflexors  2/5   4/5   Plantarflexors  4/5   5/5   Toe extensors  1/5   4/5   Toe flexors  4/5   5/5   Tone (Ashworth scale)  0  0   MSRs:  Right        Left                  brachioradialis 3+  3+  biceps 3+  3+  triceps 3+  3+  patellar 3+  3+  ankle jerk 0  2+  Hoffman no  no  plantar response down  down   SENSORY:  Normal and symmetric perception of light touch, pinprick, vibration, and proprioception.    COORDINATION/GAIT: Normal finger-to- nose-finger.  Intact rapid alternating movements bilaterally.  Mild steppage gait on the right, unassisted and stable.  Unable to perform stressed or tandem gait.   IMPRESSION: Progressive painless weakness and atrophy of the right arm and leg since 2021.  Exam shows severe muscle atrophy in the right >> left hand, right >> left foot drop, fasciculations, and hyperreflexia.  I will initiate work-up for MND, including evaluating for CNS pathology with imaging.   PLAN/RECOMMENDATIONS:  NCS/EMG of right arm and leg - MND protocol MRI brain and cervical spine without contrast Check ESR, CRP, CK, aldolase, PTH, SPEP with IFE, vitamin B12, vitamin B1, folate, copper Opted to hold off on on PT/OT and AFO at this time, will await results of work-up  Follow-up after EMG   Thank you for allowing me to participate in patient's care.  If I can answer any additional questions, I would be pleased to do so.    Sincerely,    Shawnay Bramel K. Posey Pronto, DO

## 2021-02-12 NOTE — Patient Instructions (Addendum)
MRI brain and cervical spine without   Nerve testing of the right arm and leg  Check labs  Follow-up after testing

## 2021-02-13 LAB — PARATHYROID HORMONE, INTACT (NO CA): PTH: 29 pg/mL (ref 16–77)

## 2021-02-17 LAB — PROTEIN ELECTROPHORESIS, SERUM
Albumin ELP: 5.2 g/dL — ABNORMAL HIGH (ref 3.8–4.8)
Alpha 1: 0.3 g/dL (ref 0.2–0.3)
Alpha 2: 0.6 g/dL (ref 0.5–0.9)
Beta 2: 0.3 g/dL (ref 0.2–0.5)
Beta Globulin: 0.4 g/dL (ref 0.4–0.6)
Gamma Globulin: 0.7 g/dL — ABNORMAL LOW (ref 0.8–1.7)
Total Protein: 7.5 g/dL (ref 6.1–8.1)

## 2021-02-17 LAB — IMMUNOFIXATION ELECTROPHORESIS
IgG (Immunoglobin G), Serum: 863 mg/dL (ref 600–1640)
IgM, Serum: 75 mg/dL (ref 50–300)
Immunofix Electr Int: NOT DETECTED
Immunoglobulin A: 211 mg/dL (ref 47–310)

## 2021-02-17 LAB — ALDOLASE: Aldolase: 7.8 U/L (ref <=8.1)

## 2021-02-17 LAB — VITAMIN B1: Vitamin B1 (Thiamine): 12 nmol/L (ref 8–30)

## 2021-02-17 LAB — COPPER, SERUM: Copper: 125 ug/dL (ref 70–175)

## 2021-03-04 ENCOUNTER — Other Ambulatory Visit: Payer: Self-pay

## 2021-03-04 ENCOUNTER — Ambulatory Visit (INDEPENDENT_AMBULATORY_CARE_PROVIDER_SITE_OTHER): Payer: 59 | Admitting: Neurology

## 2021-03-04 DIAGNOSIS — G1220 Motor neuron disease, unspecified: Secondary | ICD-10-CM

## 2021-03-04 DIAGNOSIS — M21371 Foot drop, right foot: Secondary | ICD-10-CM | POA: Diagnosis not present

## 2021-03-04 DIAGNOSIS — M62541 Muscle wasting and atrophy, not elsewhere classified, right hand: Secondary | ICD-10-CM

## 2021-03-04 DIAGNOSIS — R292 Abnormal reflex: Secondary | ICD-10-CM

## 2021-03-05 ENCOUNTER — Ambulatory Visit (INDEPENDENT_AMBULATORY_CARE_PROVIDER_SITE_OTHER): Payer: 59 | Admitting: Neurology

## 2021-03-05 ENCOUNTER — Other Ambulatory Visit: Payer: 59

## 2021-03-05 ENCOUNTER — Encounter: Payer: Self-pay | Admitting: Neurology

## 2021-03-05 VITALS — BP 146/110 | HR 114 | Ht 72.0 in | Wt 143.0 lb

## 2021-03-05 DIAGNOSIS — G1220 Motor neuron disease, unspecified: Secondary | ICD-10-CM | POA: Diagnosis not present

## 2021-03-05 LAB — CBC
HCT: 48 % (ref 39.0–52.0)
Hemoglobin: 16.3 g/dL (ref 13.0–17.0)
MCHC: 33.9 g/dL (ref 30.0–36.0)
MCV: 89.8 fl (ref 78.0–100.0)
Platelets: 207 10*3/uL (ref 150.0–400.0)
RBC: 5.35 Mil/uL (ref 4.22–5.81)
RDW: 13.4 % (ref 11.5–15.5)
WBC: 4.5 10*3/uL (ref 4.0–10.5)

## 2021-03-05 LAB — COMPREHENSIVE METABOLIC PANEL
ALT: 24 U/L (ref 0–53)
AST: 34 U/L (ref 0–37)
Albumin: 4.9 g/dL (ref 3.5–5.2)
Alkaline Phosphatase: 54 U/L (ref 39–117)
BUN: 12 mg/dL (ref 6–23)
CO2: 30 mEq/L (ref 19–32)
Calcium: 9.8 mg/dL (ref 8.4–10.5)
Chloride: 99 mEq/L (ref 96–112)
Creatinine, Ser: 0.93 mg/dL (ref 0.40–1.50)
GFR: 94.1 mL/min (ref >60.00)
Glucose, Bld: 92 mg/dL (ref 70–99)
Potassium: 4.2 mEq/L (ref 3.5–5.1)
Sodium: 137 mEq/L (ref 135–145)
Total Bilirubin: 0.7 mg/dL (ref 0.2–1.2)
Total Protein: 7.5 g/dL (ref 6.0–8.3)

## 2021-03-05 LAB — TSH: TSH: 2.26 u[IU]/mL (ref 0.35–5.50)

## 2021-03-05 NOTE — Addendum Note (Signed)
Addended by: Karl Luke A on: 03/05/2021 02:14 PM   Modules accepted: Orders

## 2021-03-05 NOTE — Procedures (Signed)
[facility]  [address] [place], [address]  [place], [place] [address] Tel: [phone] Fax:  [phone] Test Date:  03/04/2021  Patient: Matthew Rosario DOB: [birth year] Physician: Nita Sickle, DO  Sex: Male Height: 6' " Ref Phys: Nita Sickle, DO  ID#: [id number]   Technician:    Patient Complaints: This is a 55 year old man referred for evaluation of progressive right arm and leg weakness and dysarthria.  NCV & EMG Findings: Extensive electrodiagnostic testing of the right upper extremity, right lower extremity, right mid thoracic paraspinal muscles, and bulbar muscles shows:  All sensory responses including the right median, ulnar, radial, sural, and superficial peroneal nerves are within normal limits. Right median, peroneal (EDB), and tibial motor responses are absent.  Right ulnar motor response shows reduced amplitude (R 3.7 mV ADM, R2.3 mV FDI).  Right peroneal motor response at the tibialis anterior shows reduced amplitude (1.6 mV). Right tibial H reflex study is within normal limits. In the right upper extremity, chronic motor axonal loss changes are seen involving all the tested muscles, with active fibrillation potentials involving the intrinsic hand muscles. In the right lower extremity, chronic motor axon loss changes are seen in all the tested muscles with fibrillation potentials involving the anterior tibialis, medial gastrocnemius, and rectus femoris muscles. Active denervation is present at the mid thoracic paraspinal levels (T7 and T11 segments). Chronic motor axonal loss changes are seen affecting the mentalis and hyoglossus muscles, without accompanied active denervation. Fasciculation potentials are seen in 8 of the 18 tested muscles.  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   ___________________________ Nita Sickle, DO    Nerve Conduction Studies Anti Sensory Summary Table   Stim Site NR Peak (ms) Norm Peak (ms) P-T Amp (V) Norm P-T Amp  Right Median Anti Sensory (2nd Digit)  32C  Wrist    3.6 <3.6 20.0 >15  Right Radial Anti Sensory (Base 1st Digit)  32C  Wrist    2.4 <2.7 32.3 >14  Right Sup Peroneal Anti Sensory (Ant Lat Mall)  32C  12 cm    3.8 <4.6 6.5 >4  Right Sural Anti Sensory (Lat Mall)  32C  Calf    3.9 <4.6 15.1 >4  Right Ulnar Anti Sensory (5th Digit)  32C  Wrist    3.1 <3.1 14.8 >10   Motor Summary Table   Stim Site NR Onset (ms) Norm Onset (ms) O-P Amp (mV) Norm O-P Amp Site1 Site2 Delta-0 (ms) Dist (cm) Vel (m/s) Norm Vel (m/s)  Right Median Motor (Abd Poll Brev)  32C  Wrist NR  <4.0  >6 Elbow Wrist  0.0  >50  Elbow NR            Right Peroneal Motor (Ext Dig Brev)  32C  Ankle NR  <6.0  >2.5 B Fib Ankle  0.0  >40  B Fib NR     Poplt B Fib  0.0  >40  Poplt NR            Right Peroneal TA Motor (Tib Ant)  32C  Fib Head    4.1 <4.5 1.6 >3 Poplit Fib Head 1.9 10.0 53 >40  Poplit    6.0  1.2         Right Tibial Motor (Abd Hall Brev)  32C  Ankle NR  <6.0  >4 Knee Ankle  0.0  >40  Knee NR            Right Ulnar Motor (Abd Dig Minimi)  32C  Wrist    3.1 <3.1 3.7 >7 B Elbow Wrist 3.6 23.0 64 >50  B Elbow    7.0  3.5  A Elbow B Elbow 2.6 10.0 50 >50  A Elbow    9.6  3.3         Right Ulnar (FDI) Motor (1st DI)  32C  Wrist    4.4 <4.5 2.3 >7 B Elbow Wrist 4.1 22.0 54 >50  B Elbow    8.5  2.2  A Elbow B Elbow 2.2 10.0 50 >50  A Elbow    10.7  2.2          H Reflex Studies   NR H-Lat (ms) Lat Norm (ms) L-R H-Lat (ms)  Right Tibial (Gastroc)  32C     33.61 <35    EMG   Side Muscle Ins Act Fibs Psw Fasc Number Recrt Dur Dur. Amp Amp. Poly Poly. Comment  Right Lumbo Parasp Low Nml 1+ Nml Nml NE - - - - - - - N/A  Right Cervical Parasp Low Nml Nml Nml Nml NE - - - - - - - N/A  Right T11 Parasp Nml 1+ Nml  1+ NE - - - - - - - N/A  Right Abd Poll Brev Nml 1+ Nml Nml NE None - - - - - - ATR  Right T7 Parasp Nml 1+ Nml Nml NE None - - - - - - N/A  Right 1stDorInt Nml 1+ Nml 2+ SMU Rapid All 1+ All 1+ All 1+ ATR  Right Gastroc Nml 2+ Nml 1+ 2- Rapid All 1+ All 1+ All 1+ N/A  Right Flex Dig Long Nml Nml Nml Nml 3- Rapid All 1+ All 1+ All 1+ N/A  Right AntTibialis Nml 2+ Nml Nml SMU Rapid All 1+ All 1+ All 1+ ATR  Right GluteusMed Nml Nml Nml 2+ 2- Rapid Many 1+ Many 1+ Many 1+ N/A  Right BicepsFemS Nml Nml Nml 2+ 3- Rapid Most 1+ Most 1+ Most 1+ N/A  Right RectFemoris Nml 1+ Nml 2+ 3- Rapid Most 1+ Most 1+ Most 1+ ATR  Right Mentalis Nml Nml Nml Nml 1- Rapid Some 1+ Some 1+ Some Nml N/A  Right Hyoglossus Nml Nml Nml Nml 1- Rapid Some 1+ Some 1+ Some 1+ N/A  Right Biceps Nml Nml Nml 1+ 2- Rapid Some 1+ Some 1+ Some 1+ N/A  Right Triceps Nml Nml Nml 1+ 1- Rapid Some 1+ Some 1+ Some 1+ N/A  Right Deltoid Nml Nml Nml Nml 2- Rapid Some 1+ Some 1+ Some 1+ N/A  Right PronatorTeres Nml Nml Nml Nml 2- Rapid Some 1+ Some 1+ Some 1+ N/A      Waveforms:

## 2021-03-05 NOTE — Patient Instructions (Signed)
Check labs  Please contact [facility] to schedule your MRI.    Once you know your MRI appointment, please let me know and we will schedule a follow-up visit.

## 2021-03-05 NOTE — Progress Notes (Signed)
Follow-up Visit   Date: 03/05/21   Matthew Rosario MRN: [id number] DOB: [birth year]   Interim History: Matthew Rosario is a 55 y.o. right-handed Caucasian male with no prior medical history returning to the clinic for follow-up of progressive weakness.  The patient was accompanied to the clinic by self.  History of present illness: Starting in 2021, he began having weakness in the legs, which was noticeable as tightness in the calf muscles especially after walking.  Over the past year, he has noticed progressive weakness in the legs.  His evaluation for vascular claudication was negative.  He also saw orthopaedics who felt his tendon was weak in the right foot. He has been dragging his right foot for the past 4-5 months.  He also complains of right hand weakness and muscle loss. He denies pain in the arms or legs. Occasionally, he has cramps in the hands.  He has some weakness in his voice and has noticed that it is worse with prolonged talking.  No problems with swallowing.  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  He has two children which are healthy.   UPDATE 03/05/2021:  He is here to discuss the results of EMG and labs.  He is more aware of his hand weakness and atrophy.  He has noticed that his right hand and foot tend to cramp more and sometimes it is difficult to get the muscles to relax.  He often will manually stretch the hands and foot to get relief. He has done a lot of online research on reputable resources about his symptoms and possible diagnosis, including motor neuron disease and ALS.  He asks many informative questions.     Medications:  No current outpatient medications on file prior to visit.   No current facility-administered medications on file prior to visit.    Allergies: No Known Allergies  Vital Signs:  BP (!) 146/110   Pulse (!) 114   Ht 6' (1.829 m)   Wt 143 lb (64.9 kg)   SpO2 100%   BMI 19.39 kg/m    Neurological Exam: MENTAL STATUS including orientation to time, place, person, recent and remote memory, attention span and concentration, language, and fund of knowledge is normal.  Speech is mildly dysarthric, slow.  CRANIAL NERVES:  Normal conjugate, extra-ocular eye movements in all directions of gaze.  No ptosis.  Face is symmetric. Rare tongue fasciculations.  Bulbar reflexes present.  MOTOR:  Severe right hand atrophy with split hand, mild atrophy on the right FDI and ABP.  Moderate right TA atrophy. Active fasciculations over the upper arms (left > right).  No pronator drift.   Upper Extremity:  Right  Left  Deltoid  5/5   5/5   Biceps  5/5   5/5   Triceps  5/5   5/5   Infraspinatus 5-/5  5-/5  Medial pectoralis 5-/5  5-/5  Wrist extensors  4/5   5-/5   Wrist flexors  4/5   5-/5   Finger extensors  4/5   4/5   Finger flexors  5-/5   5/5   Dorsal interossei  3/5   4/5   Abductor pollicis  1/5   4/5   Tone (Ashworth scale)  0  0   Lower Extremity:  Right  Left  Hip flexors  5-/5   5/5   Hip extensors  5/5   5/5   Knee extensors  5-/5  5/5   Dorsiflexors  2/5   4/5   Plantarflexors  4/5   5/5   Tone (Ashworth scale)  0  0   MSRs:  Right        Left                  brachioradialis 3+  3+  biceps 3+  3+  triceps 3+  3+  patellar 3+  3+  ankle jerk 0  2+  Hoffman no  no  plantar response down  down    COORDINATION/GAIT:  Mild steppage gait on the right, unassisted  Data: Labs 02/12/2021:  CK 473*, ESR 12, CRP <1, PTH 29, vitamin B12 443, folate 11.2, aldolase 7.8, SPEP with IFE no M protein, vitamin B1 12, copper 125  NCS/EMG of the right side 03/04/2021:  The electrophysiologic findings are consistent with an 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  Results of EMG discussed with shows active denervation involving the cervical, thoracic, and lumbosacral segments with chronic changes in the cervical and lumbosacral regions consistent with evolving disorder of anterior horn cells (i.e motor neuron disease). Serology testing has largely been unremarkable, except mild elevation in CK.  I had extensive discussion with patient regarding the diagnosis of motor neuron disease, including ALS as well as ALS mimickers. There are a few additional labs I will check for MND.  I also encouraged him to complete MRI brain and cervical spine to evaluate for upper motor neuron changes on his exam.    Because he is so well-informed and had specific questions regarding MND and ALS, we briefly discussed the management of ALS, including both Radicava and riluzole, neither which are cures but may slow the disease process slightly.  If this does point more towards amyotrophic lateral sclerosis, I also mentioned that it is not unreasonable to seek a second opinion at one of the larger academic centers.     PLAN/RECOMMENDATIONS:  MRI brain and cervical spine has been ordered, patient will contact [facility] to schedule Check GM1 antibody, CBC, CMP, TSH.  He does not have any risk factors for HIV or Lyme. Once we have these results back, I will see him again to review the findings.   Total time spent reviewing records, interview, history/exam, documentation, and coordination of care on day of encounter:  60 min    Thank you for allowing me to participate in patient's care.  If I can answer any additional questions, I would be pleased to do so.    Sincerely,    Brittinee Risk K. Posey Pronto, DO

## 2021-03-10 LAB — GM1 IGG AUTOANTIBODIES: GM1 IgG Autoantibodies: <20 % (ref 0–30)

## 2021-03-10 LAB — INTERPRETATION

## 2021-03-22 ENCOUNTER — Other Ambulatory Visit: Payer: Self-pay

## 2021-03-22 ENCOUNTER — Ambulatory Visit
Admission: RE | Admit: 2021-03-22 | Discharge: 2021-03-22 | Disposition: A | Discharge status: Home / Self Care | Payer: 59 | Source: Ambulatory Visit | Attending: Neurology | Admitting: Neurology

## 2021-03-22 DIAGNOSIS — R292 Abnormal reflex: Secondary | ICD-10-CM

## 2021-03-22 DIAGNOSIS — M21371 Foot drop, right foot: Secondary | ICD-10-CM

## 2021-03-22 DIAGNOSIS — M62541 Muscle wasting and atrophy, not elsewhere classified, right hand: Secondary | ICD-10-CM

## 2021-03-22 DIAGNOSIS — G1220 Motor neuron disease, unspecified: Secondary | ICD-10-CM

## 2021-03-27 ENCOUNTER — Ambulatory Visit (INDEPENDENT_AMBULATORY_CARE_PROVIDER_SITE_OTHER): Payer: 59 | Admitting: Neurology

## 2021-03-27 ENCOUNTER — Other Ambulatory Visit: Payer: Self-pay

## 2021-03-27 ENCOUNTER — Encounter: Payer: Self-pay | Admitting: Neurology

## 2021-03-27 VITALS — BP 125/80 | HR 115 | Ht 72.0 in | Wt 142.8 lb

## 2021-03-27 DIAGNOSIS — G1221 Amyotrophic lateral sclerosis: Secondary | ICD-10-CM | POA: Diagnosis not present

## 2021-03-27 HISTORY — DX: Amyotrophic lateral sclerosis: G12.21

## 2021-03-27 NOTE — Patient Instructions (Addendum)
Start physical therapy, occupational therapy, and speech therapy  Referral to [facility] for right ankle foot orthotic  For more information, you may visit [email or web address]  If you choose to proceed with genetic testing or seek a second opinion, please let me know  Return clinic in 3 months

## 2021-03-27 NOTE — Progress Notes (Signed)
Follow-up Visit   Date: 03/27/21   Matthew Rosario MRN: [id number] DOB: 14-Nov-1965   Interim History: Matthew Rosario is a 55 y.o. right-handed Caucasian male with no prior medical history returning to the clinic for follow-up of motor neuron disease.  The patient was accompanied to the clinic by self.  History of present illness: Starting in 2021, he began having weakness in the legs, which was noticeable as tightness in the calf muscles especially after walking.  Over the past year, he has noticed progressive weakness in the legs.  His evaluation for vascular claudication was negative.  He also saw orthopaedics who felt his tendon was weak in the right foot. He has been dragging his right foot for the past 4-5 months.  He also complains of right hand weakness and muscle loss. He denies pain in the arms or legs. Occasionally, he has cramps in the hands.  He has some weakness in his voice and has noticed that it is worse with prolonged talking.  No problems with swallowing.  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  He has two children which are healthy.   UPDATE 03/05/2021:  He is here to discuss the results of EMG and labs.  He is more aware of his hand weakness and atrophy.  He has noticed that his right hand and foot tend to cramp more and sometimes it is difficult to get the muscles to relax.  He often will manually stretch the hands and foot to get relief. He has done a lot of online research on reputable resources about his symptoms and possible diagnosis, including motor neuron disease and ALS.  He asks many informative questions.    UPDATE 03/27/2021:  He is here for follow-up with his mother Matthew Ng) and daughter Matthew Matar) to discuss the results of his imaging and remaining labs.  Much of today's discussion involved reviewing the testing performed to date and how it relates to patient's symptomology and diagnosis.  Clinically, there  has been no significant change.  He continues to have prominent right foot drop and has noticed some difficulty with fine motor tasks.  Speech remains slow and softer, which is worse with prolonged talking.  He denies difficulty with swallowing or shortness of breath.  Medications:  No current outpatient medications on file prior to visit.   No current facility-administered medications on file prior to visit.    Allergies: No Known Allergies  Vital Signs:  BP 125/80   Pulse (!) 115   Ht 6' (1.829 m)   Wt 142 lb 12.8 oz (64.8 kg)   SpO2 94%   BMI 19.37 kg/m   Neurological Exam: MENTAL STATUS including orientation to time, place, person, recent and remote memory, attention span and concentration, language, and fund of knowledge is normal.  Speech is mildly dysarthric, slow, loss of prosody.  CRANIAL NERVES:  Normal conjugate, extra-ocular eye movements in all directions of gaze.  No ptosis.  Face is symmetric. Rare tongue fasciculations.  Bulbar reflexes present.  MOTOR:  Severe right hand atrophy with split hand, mild atrophy on the right FDI and ABP.  Moderate right TA atrophy. Active fasciculations in the arms..  No pronator drift.   Upper Extremity:  Right  Left  Deltoid  5/5   5/5   Biceps  5/5   5/5   Triceps  5/5   5/5   Wrist extensors  4/5   5-/5   Wrist  flexors  4/5   5-/5   Finger extensors  4/5   4/5   Finger flexors  5-/5   5/5   Dorsal interossei  3/5   4/5   Abductor pollicis  1/5   4/5   Tone (Ashworth scale)  0  0   Lower Extremity:  Right  Left  Hip flexors  5/5   5/5   Hip extensors  5/5   5/5   Knee extensors  5-/5   5/5   Dorsiflexors  2/5   4/5   Plantarflexors  4/5   5/5   Tone (Ashworth scale)  0  0   COORDINATION/GAIT:  Prominent steppage gait on the right, unassisted  Data: Labs 02/12/2021:  CK 473*, ESR 12, CRP <1, PTH 29, vitamin B12 443, folate 11.2, aldolase 7.8, SPEP with IFE no M protein, vitamin B1 12, copper 125  Labs 03/05/2021: GM1  negative  MRI brain and cervical spine wo contrast 03/24/2021: IMPRESSION: Brain MRI: No specific cause for symptoms.   Cervical MRI:  1. 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 2. Up to mild spinal stenosis at C4-5.  Normal cord signal.  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  Diagnosis of ALS is supported by EMG showing lower motor neuron dysfunction in the cervical, thoracic, and lumbosacral segments, clinical exam (hyperreflexia, fasciculations, atrophy, weakness), and absence of other motor neuron disease mimickers.  I had a long discussion today with patient, his mother, and daughter about the diagnosis, evaluation, and management options.  We discussed riluzole which he will think about and let me know.  I also offered seeking a second opinion at either [facility], especially if he is interested in clinical trials.  Finally, I briefly discussed genetic testing, although he has not family history of ALS. At this point, he will consider these options and notify me via Mychart of his decision.    For now, I recommend that we start him with PT, OT, and speech therapy I will refer him to [facility] for right AFO  Return to clinic in 3 months, or sooner as needed  Total time spent reviewing records, interview, history/exam, documentation, and coordination of care on day of encounter:  70 min   Thank you for allowing me to participate in patient's care.  If I can answer any additional questions, I would be pleased to do so.    Sincerely,    Nimesh Riolo K. Posey Pronto, DO

## 2021-04-13 ENCOUNTER — Ambulatory Visit: Payer: 59 | Admitting: Occupational Therapy

## 2021-04-13 ENCOUNTER — Ambulatory Visit: Payer: 59 | Attending: Neurology

## 2021-04-13 ENCOUNTER — Encounter: Payer: Self-pay | Admitting: Occupational Therapy

## 2021-04-13 ENCOUNTER — Other Ambulatory Visit: Payer: Self-pay

## 2021-04-13 DIAGNOSIS — R2689 Other abnormalities of gait and mobility: Secondary | ICD-10-CM | POA: Insufficient documentation

## 2021-04-13 DIAGNOSIS — M6281 Muscle weakness (generalized): Secondary | ICD-10-CM

## 2021-04-13 DIAGNOSIS — R278 Other lack of coordination: Secondary | ICD-10-CM | POA: Diagnosis present

## 2021-04-13 DIAGNOSIS — R2681 Unsteadiness on feet: Secondary | ICD-10-CM | POA: Diagnosis present

## 2021-04-13 DIAGNOSIS — R262 Difficulty in walking, not elsewhere classified: Secondary | ICD-10-CM | POA: Diagnosis present

## 2021-04-13 DIAGNOSIS — R29818 Other symptoms and signs involving the nervous system: Secondary | ICD-10-CM | POA: Insufficient documentation

## 2021-04-13 DIAGNOSIS — M21371 Foot drop, right foot: Secondary | ICD-10-CM | POA: Insufficient documentation

## 2021-04-13 NOTE — Therapy (Signed)
[facility] [address] [place], [place], [address] Phone: [phone]   Fax:  [phone]  Physical Therapy Evaluation  Patient Details  Name: Matthew Rosario MRN: [id number] Date of Birth: January 02, 1966 No data recorded  Encounter Date: 04/13/2021   PT End of Session - 04/13/21 1338     Visit Number 1    Number of Visits 13    Date for PT Re-Evaluation 05/29/21    Authorization Type [employer] (25 VL - PT)    PT Start Time 1100    PT Stop Time 1145    PT Time Calculation (min) 45 min    Activity Tolerance Patient tolerated treatment well    Behavior During Therapy [facility] for tasks assessed/performed             Past Medical History:  Diagnosis Date   Amyotrophic lateral sclerosis (HCC) 03/27/2021   Low testosterone     Past Surgical History:  Procedure Laterality Date   HERNIA REPAIR      There were no vitals filed for this visit.    Subjective Assessment - 04/13/21 1102     Subjective Pt was diagnosed with ALS two weeks ago. Pt states his first symptom was a year ago w/ tightness in his Achilles and has had this problem up until this year. Pt was prescribed muscle relaxers and pain killers even though he didn't have any pain. Pt reports he has drop foot in the right and states he uses cane on the right side becasue it allows him to pick his foot up as well. Pt has a weaker voice and a weaker right hand. Pt was sent to a vascular doctor, then orthopedic docto, and back to his PCP and that is when his voice, R Leg, and R hand s/s came. Pt doesn't report any falls but he has caught the toes on that foot. Pt denies pain. Pt uses cane when he is out in the community. Pt's house is one story but he has 3 steps to get into the house and states it is okay for right now. Pt is not taking any medications currently for ALS but is debating starting it due "prolonging 3 months only in the end stage". Pt states he already has  been fitted for an AFO at [facility]. Pt reports decreased endurance w/ things such as mowing the lawn and grocery shopping. Pt reports he has lost 20-25lbs since July.    Pertinent History no significant PMH prior to ALS diagnosis    Limitations House hold activities;Standing;Walking    Diagnostic tests Cervical MRI:1. 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  2. Up to mild spinal stenosis at C4-5. Normal cord signal.  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    Patient Stated Goals Pt's goal is to be able to walk more comfortably and being able to maintain the strength that he has.    Currently in Pain? No/denies                [facility] PT Assessment - 04/13/21 1113       Assessment   Medical Diagnosis ALS    Onset Date/Surgical Date 03/30/21    Hand Dominance Right    Prior Therapy None      Precautions  Precautions Fall    Required Braces or Orthoses Other Brace/Splint    Other Brace/Splint getting an AFO for right foot      Balance Screen   Has the patient fallen in the past 6 months No    Has the patient had a decrease in activity level because of a fear of falling?  No    Is the patient reluctant to leave their home because of a fear of falling?  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    Leisure be outside      [employer]   Overall Cognitive Status Within Functional Limits for tasks assessed      Observation/Other Assessments   Focus on Therapeutic Outcomes (FOTO)  N/A      Sensation   Light Touch Appears Intact    Additional Comments pt denies any sensation deficits in BLE      ROM / Strength   AROM / PROM / Strength Strength      Strength   Overall Strength Deficits    Strength Assessment Site Hip;Knee;Ankle    Right/Left Hip Right;Left    Right Hip Flexion 4-/5    Right Hip ABduction 5/5    Right Hip ADduction 5/5    Left Hip Flexion 4-/5    Left Hip ABduction 5/5    Left Hip ADduction 5/5    Right/Left Knee Right;Left    Right Knee Flexion 4-/5    Right Knee Extension 4+/5    Left Knee Flexion 3+/5    Left Knee Extension 4+/5    Right/Left Ankle Right;Left    Right Ankle Dorsiflexion 1/5    Left Ankle Dorsiflexion 3-/5      Transfers   Five time sit to stand comments  11.38 seconds UE assit on legs      Ambulation/Gait   Ambulation/Gait Yes    Gait Pattern Step-through pattern;Decreased step length - right;Decreased stance time - right;Decreased weight shift to right;Poor foot clearance - right    Ambulation Surface Level;Indoor    Gait velocity 13.09 sec w/ cane=2.51 ft/sec, 11.97 sec w/o cane= 2.74    Gait Comments Pt waiting for R AFO      Standardized Balance Assessment   Standardized Balance Assessment Berg Balance Test      Berg Balance Test   Sit to Stand Able to stand  independently using hands    Standing Unsupported Able to stand safely 2 minutes    Sitting with Back Unsupported but Feet Supported on Floor or Stool Able to sit safely and securely 2 minutes    Stand to Sit Sits safely with minimal use of hands    Transfers Able to transfer safely, minor use of hands    Standing Unsupported with Eyes Closed Able to stand 10 seconds safely    Standing Unsupported with Feet Together Able to place feet together  independently and stand 1 minute safely  From Standing, Reach Forward with Outstretched Arm Can reach confidently >25 cm (10")    From Standing Position, Pick up Object from Floor Able to pick up shoe safely and easily    From Standing Position, Turn to Look Behind Over each Shoulder Looks behind from both sides and weight shifts well    Turn 360 Degrees Able to turn 360 degrees safely in 4 seconds or less    Standing Unsupported, Alternately Place Feet on Step/Stool Able to complete >2 steps/needs minimal assist    Standing Unsupported, One Foot in Front Able to take small step independently and hold 30 seconds    Standing on One Leg Tries to lift leg/unable to hold 3 seconds but remains standing independently    Total Score 47               Objective measurements completed on examination: See above findings.    PT Education - 04/13/21 1337     Education Details PT edcuated pt on POC.    Person(s) Educated Patient    Methods Explanation    Comprehension Verbalized understanding              PT Short Term Goals - 04/13/21 1405       PT SHORT TERM GOAL #1   Title Pt will be able to complete 5x sit <> stand in under 10 seconds w/o UE assist to demonstrate a decreased fall risk.    Time 3    Period Weeks    Status New    Target Date 05/08/21      PT SHORT TERM GOAL #2   Title Pt will be able to ambulate at >/= 2.62 ft/s w/ LRAD for improved community ambulation and functional mobility.    Baseline 11/14- 13.09 sec= 2.51 ft/sec    Time 3    Period Weeks    Status New    Target Date 05/08/21      PT SHORT TERM GOAL #3   Title Pt will be able to ambulate 500' supervision over varied surfaces supervision, w/ LRAD for improved community ambulation and activity tolerance.    Time 3    Period Weeks    Status New    Target Date 05/08/21      PT SHORT TERM GOAL #4   Title Pt will be independent and educated on intial HEP for balance and BLE strength maintanence.     Time 3    Period Weeks    Status New    Target Date 05/08/21               PT Long Term Goals - 04/13/21 1414       PT LONG TERM GOAL #1   Title Pt will be able to independently donn and doff AFO and adhere to wear schedule demonstrating improved orthosis knowledge. (LTG due 05/29/21)    Time 6    Period Weeks    Status New    Target Date 05/29/21      PT LONG TERM GOAL #2   Title Pt will be indpendent w/ final HEP for improved balance and strength maintanence.    Time 6    Period Weeks    Status New    Target Date 05/29/21      PT LONG TERM GOAL #3   Title Pt will be able to ambulate >/= 1000' ft on varied surfaces supervision for improved community ambulation.    Time 6    Period Weeks  Status New    Target Date 05/29/21      PT LONG TERM GOAL #4   Title Pt will score >/= 50/56 on Berg Balance Scale demonstrating improved balance for decreased fall risk.    Baseline 11/14- 47/56    Time 6    Period Weeks    Status New    Target Date 05/29/21                    Plan - 04/13/21 1342     Clinical Impression Statement Pt presents to the clinic following ALS diagnosis on 03/27/21. Pt presents to the clinic w/ deficits in gait, balance, and BLE strength. Pt scored a 47/56 on Berg Balance, and required 11.38 seconds to complete the 5x sit <> stand categorizing him at an increased fall risk. Pt ambulates w/ a cane at 2.58 ft/sec placing the pt as a limited community ambulator. 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 Pt presents w/ deficits in strength noted through MMT. Pt has trace RLE dorsiflexion but will be recieving an AFO via [facility] in upcoming weeks. Pt presents w/ decreased activity tolerance as noted in pt's subjective and demonstrated by pt requiring breaks today during balance assessment. Skilled PT services is required to address the deficits listed above and  facilitate pt achieving LTG.    Personal Factors and Comorbidities Time since onset of injury/illness/exacerbation    Examination-Activity Limitations Squat;Stand;Stairs;Transfers;Lift;Locomotion Level    Examination-Participation Restrictions Psychologist, forensic;Yard Work;Shop    Stability/Clinical Decision Making Evolving/Moderate complexity    Clinical Decision Making Moderate    Rehab Potential Good    PT Frequency 2x / week   plus eval   PT Duration 6 weeks    PT Treatment/Interventions ADLs/Self Care Home Management;DME Instruction;Gait training;Stair training;Functional mobility training;Therapeutic activities;Therapeutic exercise;Balance training;Manual techniques;Orthotic Fit/Training;Patient/family education;Neuromuscular re-education;Passive range of motion    PT Next Visit Plan Provide intial HEP focused on balance and maintaining overall BLE strength    PT Home Exercise Plan Will be provided next visit    Consulted and Agree with Plan of Care Patient             Patient will benefit from skilled therapeutic intervention in order to improve the following deficits and impairments:  Abnormal gait, Decreased activity tolerance, Decreased balance, Decreased range of motion, Decreased mobility, Decreased knowledge of use of DME, Decreased endurance, Decreased strength, Difficulty walking  Visit Diagnosis: Difficulty in walking, not elsewhere classified  Unsteadiness on feet  Muscle weakness (generalized)  Foot drop, right     Problem List Patient Active Problem List   Diagnosis Date Noted   Amyotrophic lateral sclerosis (HCC) 03/27/2021   Achilles tendon pain 10/21/2020    Tempie Donning, PT 04/13/2021, 6:21 PM  [facility] [address] [place], [place], [address] Phone: [phone]   Fax:  [phone]  Name: Matthew Rosario MRN: [id number] Date of Birth: 03/14/1966

## 2021-04-13 NOTE — Therapy (Signed)
[facility] [address] [place], [place], [address] Phone: [phone]   Fax:  [phone]  Occupational Therapy Evaluation  Patient Details  Name: Matthew Rosario MRN: [id number] Date of Birth: 1966/04/03 Referring Provider (OT): Nita Sickle, DO   Encounter Date: 04/13/2021   OT End of Session - 04/13/21 1550     Visit Number 1    Number of Visits 25    Date for OT Re-Evaluation 07/06/21    Authorization Type [employer]    Authorization Time Period VL: 25 OT or $2000 max, 25 PT or $2000 max, ST not covered    Authorization - Number of Visits 25    OT Start Time 1015    OT Stop Time 1100    OT Time Calculation (min) 45 min    Activity Tolerance Patient tolerated treatment well    Behavior During Therapy [facility] for tasks assessed/performed             Past Medical History:  Diagnosis Date   Amyotrophic lateral sclerosis (HCC) 03/27/2021   Low testosterone     Past Surgical History:  Procedure Laterality Date   HERNIA REPAIR      There were no vitals filed for this visit.   Subjective Assessment - 04/13/21 1600     Subjective  Pt is a 55 year old male that presents to Neuro OPOT s/p recent diagnosis of Amyotrophic Lateral Sclerosis (ALS). Pt with no significant PMH prior. Pt was independent prior to decline and is currently still living independenty in a one story home. Pt is currently employed as a Pension scheme manager. Pt reports most difficulty with typing, handwriting and his RUE being weaker and more trophied than LUE. Pt is currently still driving and working. Pt received diagnosis approx 2 weeks ago at time of evaluation. Pt denies pain and denies any visual changes.    Pertinent History PMH: none    Limitations Fall Risk    Patient Stated Goals "better adapt to what may come" "typing"    Currently in Pain? No/denies               [facility] OT Assessment - 04/13/21 1024       Assessment   Medical  Diagnosis ALS    Referring Provider (OT) Nita Sickle, DO    Onset Date/Surgical Date 03/30/21   diagnosis approx 2 weeks prior to eval   Hand Dominance Right    Prior Therapy None      Precautions   Precautions Fall    Required Braces or Orthoses Other Brace/Splint    Other Brace/Splint getting an AFO for right foot      Balance Screen   Has the patient fallen in the past 6 months No      Home  Environment   Family/patient expects to be discharged to: Private residence    Living Arrangements Children   daughter comes every other week   Type of Home House    Home Layout Able to live on main level with bedroom/bathroom    Bathroom Shower/Tub Tub/Shower unit    Home Equipment [place] - single point;Grab bars - tub/shower      Prior Function   Level of Independence Independent    Vocation Full time employment    Vocation Requirements business attorney    Leisure be outside      ADL   Eating/Feeding Modified independent   at times cutting up things can be difficult   Grooming Modified  independent    Upper Body Bathing Modified independent    Lower Body Bathing Modified independent    Upper Body Dressing Increased time;Needs assist for fasteners   on bad days   Lower Body Dressing Modified independent;Needs assist for fasteners   on bad days buttoning pants can be hard   Toilet Transfer Modified independent    Toileting - Clothing Manipulation Modified independent    Toileting -  Hygiene Modified Independent    Tub/Shower Transfer Modified independent    Secretary/administrator bars      IADL   Shopping Takes care of all shopping needs independently    Light Housekeeping Does personal laundry completely;Maintains house alone or with occasional assistance;Performs light daily tasks such as dishwashing, bed making    Meal Prep Plans, prepares and serves adequate meals independently   has some difficulty with chopping up vegetables and items   Community Mobility Drives  own vehicle    Medication Management Is responsible for taking medication in correct dosages at correct time   none right now   Development worker, community financial matters independently (budgets, writes checks, pays rent, bills goes to bank), collects and keeps track of income      Mobility   Mobility Status Comments ambulated into eval with cane      Written Expression   Dominant Hand Right    Handwriting 75% legible   approx 75% of prior level of HW     Vision - History   Baseline Vision Wears contact    Additional Comments pt denies any visual changes      Activity Tolerance   Activity Tolerance Tolerates 30 min activity with multiple rests   needs brek after approx 30 minutes     Observation/Other Assessments   Focus on Therapeutic Outcomes (FOTO)  N/A      Sensation   Light Touch Appears Intact    Additional Comments pt denies any sensation deficits      Coordination   Gross Motor Movements are Fluid and Coordinated No    Fine Motor Movements are Fluid and Coordinated No    Finger Nose Finger Test intact - no deficits noted    9 Hole Peg Test Right;Left    Right 9 Hole Peg Test 52.84s    Left 9 Hole Peg Test 26.47s      ROM / Strength   AROM / PROM / Strength AROM;Strength      AROM   Overall AROM  Within functional limits for tasks performed      Strength   Overall Strength Within functional limits for tasks performed    Overall Strength Comments WFL for tasks performed however RUE with decreased strength. Pt's RUE hand shows signs of significant atrophy especially in thumb CMC joint and lumbricals      Hand Function   Right Hand Gross Grasp Impaired   significant atrophy   Right Hand Grip (lbs) 56.6    Right Hand Lateral Pinch 8 lbs    Right Hand 3 Point Pinch 2 lbs   Tip: 3 - pt with difficulty getting pad/pad pinch d/t thumb   Left Hand Gross Grasp Functional    Left Hand Grip (lbs) 60.8    Left Hand Lateral Pinch 10 lbs    Left 3 point pinch 9 lbs   Tip  9  OT Short Term Goals - 04/13/21 1625       OT SHORT TERM GOAL #1   Title Pt will be independent with HEP for coordination and grip strength    Time 4    Period Weeks    Status New    Target Date 05/11/21      OT SHORT TERM GOAL #2   Title Pt will verbalize understanding of adapted strategies and/or equipment for increasing independence and safety with ADLs and IADLs. (cutting vegetables, buttons, fasteners, shoes)    Time 4    Period Weeks    Status New      OT SHORT TERM GOAL #3   Title Pt will write 2-3 sentences with 90% legibility with adapted equipment PRN. (possibly built up grip)    Baseline approx 75% of baseline HW    Time 4    Period Weeks    Status New      OT SHORT TERM GOAL #4   Title Pt will verbalize understanding of community resources for ALS    Time 4    Period Weeks    Status New               OT Long Term Goals - 04/13/21 1631       OT LONG TERM GOAL #1   Title Pt will verbalize and demonstrate understanding of any splints and/or orthoses wear and care PRN    Time 12    Period Weeks    Status New    Target Date 07/06/21      OT LONG TERM GOAL #2   Title Pt will be independent with HEP for BUE ROM and stretches.    Time 12    Period Weeks    Status New      OT LONG TERM GOAL #3   Title Pt will increase coordination in RUE by completing 9 hole peg test in 45 seconds or less. (with use of splint PRN)    Time 12    Period Weeks    Status New      OT LONG TERM GOAL #4   Title Pt will verbalize understanding of adapted equipment and technology and strategies for work (typing, voice to text, etc)    Time 12    Period Weeks    Status New      OT LONG TERM GOAL #5   Title Pt will increase grip strength in RUE, dominant hand.    Baseline R 56.6, L 60.8    Time 12    Period Weeks    Status New                   Plan - 04/13/21 1053     Clinical Impression Statement Pt  is a 55 year old male that presents to Neuro OPOT s/p recent diagnosis of Amyotrophic Lateral Sclerosis (ALS). Pt with no significant PMH prior. Pt started hving weakness in legs in 2021 and has noticed progressive weakness in the legs over the past year. Evaluation for vascular claudication was negative. Pt has had significant foot drop for the past 4-5 months. Pt then complained of right hand weakness and muscle loss with occasional cramps. Pt now has weakness in his vice and has noticed that it is worse with prolonged talking. No problems with swallowing thus far.   Pt is now presenting to occupational therapy evaluation with significant decreased coordination and atrophy in RUE and progressing in LUE. Pt presents with unsteadiness  on feet and decreased strength. Skilled occupational therapy is recommended to target listed areas of deficit, provide strategies for maintaining independence and increasing independence as disease progresses.    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 ADL;Strength;Decreased knowledge of use of DME;FMC;Sensation;Coordination;ROM;IADL;UE functional use;Dexterity;Balance;GMC    Rehab Potential Good    Clinical Decision Making Several treatment options, min-mod task modification necessary    Comorbidities Affecting Occupational Performance: May have comorbidities impacting occupational performance    Modification or Assistance to Complete Evaluation  Min-Moderate modification of tasks or assist with assess necessary to complete eval    OT Frequency 2x / week    OT Duration 12 weeks    OT Treatment/Interventions Aquatic Therapy;Self-care/ADL training;Fluidtherapy;DME and/or AE  instruction;Splinting;Therapeutic activities;Ultrasound;Therapeutic exercise;Coping strategies training;Functional Mobility Training;Neuromuscular education;Cryotherapy;Paraffin;Energy conservation;Electrical Stimulation;Manual Therapy;Patient/family education    Plan coordination HEP, theraputty?, community resources and more information re: ALS and clinics, adapted equipment for chopping vegetables, etc.    Recommended Other Services recommended ST evaluation at least even though ST is not covered by insurance.    Consulted and Agree with Plan of Care Patient             Patient will benefit from skilled therapeutic intervention in order to improve the following deficits and impairments:   Body Structure / Function / Physical Skills: ADL, Strength, Decreased knowledge of use of DME, FMC, Sensation, Coordination, ROM, IADL, UE functional use, Dexterity, Balance, GMC       Visit Diagnosis: Muscle weakness (generalized)  Other lack of coordination  Unsteadiness on feet  Other abnormalities of gait and mobility  Other symptoms and signs involving the nervous system    Problem List Patient Active Problem List   Diagnosis Date Noted   Amyotrophic lateral sclerosis (HCC) 03/27/2021   Achilles tendon pain 10/21/2020    Junious Dresser, OT/L 04/13/2021, 4:40 PM  [facility] [address] [place], [place], [address] Phone: [phone]   Fax:  [phone]  Name: MELFORD SAWHNEY MRN: [id number] Date of Birth: [birth year]

## 2021-04-15 ENCOUNTER — Ambulatory Visit: Payer: 59 | Admitting: Occupational Therapy

## 2021-04-15 ENCOUNTER — Encounter: Payer: 59 | Admitting: Speech Pathology

## 2021-04-15 ENCOUNTER — Other Ambulatory Visit: Payer: Self-pay

## 2021-04-15 DIAGNOSIS — R262 Difficulty in walking, not elsewhere classified: Secondary | ICD-10-CM | POA: Diagnosis not present

## 2021-04-15 DIAGNOSIS — R278 Other lack of coordination: Secondary | ICD-10-CM

## 2021-04-15 DIAGNOSIS — M6281 Muscle weakness (generalized): Secondary | ICD-10-CM

## 2021-04-15 DIAGNOSIS — R29818 Other symptoms and signs involving the nervous system: Secondary | ICD-10-CM

## 2021-04-15 NOTE — Therapy (Signed)
[facility] [address] [place], [place], [address] Phone: [phone]   Fax:  [phone]  Occupational Therapy Treatment  Patient Details  Name: Matthew Rosario MRN: [id number] Date of Birth: 08/29/1965 Referring Provider (OT): Nita Sickle, DO   Encounter Date: 04/15/2021   OT End of Session - 04/15/21 1412     Visit Number 2    Number of Visits 25    Date for OT Re-Evaluation 07/06/21    Authorization Type [employer]    Authorization Time Period VL: 25 OT or $2000 max, 25 PT or $2000 max, ST not covered    Authorization - Number of Visits 25    OT Start Time 1230    OT Stop Time 1315    OT Time Calculation (min) 45 min    Activity Tolerance Patient tolerated treatment well    Behavior During Therapy [facility] for tasks assessed/performed             Past Medical History:  Diagnosis Date   Amyotrophic lateral sclerosis (HCC) 03/27/2021   Low testosterone     Past Surgical History:  Procedure Laterality Date   HERNIA REPAIR      There were no vitals filed for this visit.   Subjective Assessment - 04/15/21 1237     Pertinent History PMH: none    Limitations Fall Risk    Patient Stated Goals "better adapt to what may come" "typing"    Currently in Pain? No/denies            Pt issued info on [employer] and reviewed. Also reviewed [place] Chapter for ALS association and tabs that may be helpful including: support groups, newly diagnosed tab, etc as pt was recently diagnosed. Also discussed speech therapy and how it may benefit him due to difficulty speaking, SOB, etc.   Discussed ALS clinics, how they are set up/operate, and how they may be a better option once d/c from this clinic.   Pt issued tan foam for pen to increase ease w/ writing. Pt issued red foam for future use on eating utensil. Discussed possible need for electric toothbrush  Fabricated and fitted Rt CMC splint to  improve position of thumb into more palmer abduction (sits in lateral adduction) and function of hand. Pt able to write better w/ splint on and tan foam on pen                      OT Education - 04/15/21 1314     Education Details splint wear and care, [employer] info, ALS [place] chapter info including support groups and newly diagnosed info, tan foam issued for pen (red foam issued for future use of eating utensils)    Person(s) Educated Patient    Methods Explanation;Demonstration;Handout    Comprehension Verbalized understanding;Returned demonstration              OT Short Term Goals - 04/15/21 1412       OT SHORT TERM GOAL #1   Title Pt will be independent with HEP for coordination and grip strength    Time 4    Period Weeks    Status New    Target Date 05/11/21      OT SHORT TERM GOAL #2   Title Pt will verbalize understanding of adapted strategies and/or equipment for increasing independence and safety with ADLs and IADLs. (cutting vegetables, buttons, fasteners, shoes)    Time 4    Period  Weeks    Status New      OT SHORT TERM GOAL #3   Title Pt will write 2-3 sentences with 90% legibility with adapted equipment PRN. (possibly built up grip)    Baseline approx 75% of baseline HW    Time 4    Period Weeks    Status On-going      OT SHORT TERM GOAL #4   Title Pt will verbalize understanding of community resources for ALS    Time 4    Period Weeks    Status On-going               OT Long Term Goals - 04/15/21 1413       OT LONG TERM GOAL #1   Title Pt will verbalize and demonstrate understanding of any splints and/or orthoses wear and care PRN    Time 12    Period Weeks    Status On-going      OT LONG TERM GOAL #2   Title Pt will be independent with HEP for BUE ROM and stretches.    Time 12    Period Weeks    Status New      OT LONG TERM GOAL #3   Title Pt will increase coordination in RUE by completing 9 hole peg test in 45 seconds or  less. (with use of splint PRN)    Time 12    Period Weeks    Status New      OT LONG TERM GOAL #4   Title Pt will verbalize understanding of adapted equipment and technology and strategies for work (typing, voice to text, etc)    Time 12    Period Weeks    Status New      OT LONG TERM GOAL #5   Title Pt will increase grip strength in RUE, dominant hand.    Baseline R 56.6, L 60.8    Time 12    Period Weeks    Status New                   Plan - 04/15/21 1413     Clinical Impression Statement Pt progressing with knowledge of ALS, ALS clinics and potential future A/E needs.    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 ADL;Strength;Decreased knowledge of use of DME;FMC;Sensation;Coordination;ROM;IADL;UE functional use;Dexterity;Balance;GMC    Rehab Potential Good    Clinical Decision Making Several treatment options, min-mod task modification necessary    Comorbidities Affecting Occupational Performance: May have comorbidities impacting occupational performance    Modification or Assistance to Complete Evaluation  Min-Moderate modification of tasks or assist with assess necessary to complete eval    OT Frequency 2x / week    OT Duration 12 weeks    OT Treatment/Interventions Aquatic Therapy;Self-care/ADL training;Fluidtherapy;DME and/or AE instruction;Splinting;Therapeutic activities;Ultrasound;Therapeutic exercise;Coping strategies training;Functional Mobility Training;Neuromuscular education;Cryotherapy;Paraffin;Energy conservation;Electrical Stimulation;Manual Therapy;Patient/family education    Plan set up SLP evaluation if agreeable (pt cancelled initially secondary to not covered by  insurance, but after discussion, agreed to proceed for short duration), splint check/adjustments prn, A/E recommendations and task modifications (button hook, alternatives for cutting/chopping, etc). If time, show pt intrinsic hand movements (thumb palmer abd, intrinsic +) for HEP    Recommended Other Services recommended ST evaluation at least  even though ST is not covered by insurance.    Consulted and Agree with Plan of Care Patient             Patient will benefit from skilled therapeutic intervention in order to improve the following deficits and impairments:   Body Structure / Function / Physical Skills: ADL, Strength, Decreased knowledge of use of DME, FMC, Sensation, Coordination, ROM, IADL, UE functional use, Dexterity, Balance, GMC       Visit Diagnosis: Muscle weakness (generalized)  Other lack of coordination  Other symptoms and signs involving the nervous system    Problem List Patient Active Problem List   Diagnosis Date Noted   Amyotrophic lateral sclerosis (HCC) 03/27/2021   Achilles tendon pain 10/21/2020    Kelli Churn, OTR/L 04/15/2021, 2:22 PM  [facility] [address] [place], [place], [address] Phone: [phone]   Fax:  [phone]  Name: Matthew Rosario MRN: [id number] Date of Birth: 23-Jan-1966

## 2021-04-15 NOTE — Patient Instructions (Signed)
Your Splint This splint should initially be fitted by a healthcare practitioner.  The healthcare practitioner is responsible for providing wearing instructions and precautions to the patient, other healthcare practitioners and care provider involved in the patient's care.  This splint was custom made for you. Please read the following instructions to learn about wearing and caring for your splint.  Precautions Should your splint cause any of the following problems, remove the splint immediately and contact your therapist/physician. Swelling Severe Pain Pressure Areas Stiffness Numbness  Do not wear your splint while operating machinery unless it has been fabricated for that purpose.  When To Wear Your Splint Where your splint according to your therapist/physician instructions. Daytime for specific tasks. Build up tolerance   Care and Cleaning of Your Splint Keep your splint away from open flames. Your splint will lose its shape in temperatures over 135 degrees Farenheit, ( in car windows, near radiators, ovens or in hot water).  Never make any adjustments to your splint, if the splint needs adjusting remove it and make an appointment to see your therapist. Your splint may be cleaned with rubbing alcohol.  Do not immerse in hot water over 135 degrees Farenheit.

## 2021-04-17 ENCOUNTER — Encounter: Payer: Self-pay | Admitting: Physical Therapy

## 2021-04-17 ENCOUNTER — Telehealth: Payer: Self-pay

## 2021-04-17 ENCOUNTER — Ambulatory Visit: Payer: 59 | Admitting: Physical Therapy

## 2021-04-17 ENCOUNTER — Other Ambulatory Visit: Payer: Self-pay

## 2021-04-17 DIAGNOSIS — G1220 Motor neuron disease, unspecified: Secondary | ICD-10-CM

## 2021-04-17 DIAGNOSIS — R262 Difficulty in walking, not elsewhere classified: Secondary | ICD-10-CM | POA: Diagnosis not present

## 2021-04-17 DIAGNOSIS — M6281 Muscle weakness (generalized): Secondary | ICD-10-CM

## 2021-04-17 DIAGNOSIS — G1221 Amyotrophic lateral sclerosis: Secondary | ICD-10-CM

## 2021-04-17 DIAGNOSIS — R2681 Unsteadiness on feet: Secondary | ICD-10-CM

## 2021-04-17 DIAGNOSIS — R29818 Other symptoms and signs involving the nervous system: Secondary | ICD-10-CM

## 2021-04-17 NOTE — Therapy (Signed)
[facility] [address] [place], [place], [address] Phone: [phone]   Fax:  [phone]  Physical Therapy Treatment  Patient Details  Name: Matthew Rosario MRN: [id number] Date of Birth: 1965/08/13 No data recorded  Encounter Date: 04/17/2021   PT End of Session - 04/17/21 1105     Visit Number 2    Number of Visits 13    Date for PT Re-Evaluation 05/29/21    Authorization Type [employer] (25 VL - PT)    PT Start Time 1103    PT Stop Time 1145    PT Time Calculation (min) 42 min    Activity Tolerance Patient tolerated treatment well    Behavior During Therapy [facility] for tasks assessed/performed             Past Medical History:  Diagnosis Date   Amyotrophic lateral sclerosis (HCC) 03/27/2021   Low testosterone     Past Surgical History:  Procedure Laterality Date   HERNIA REPAIR      There were no vitals filed for this visit.   Subjective Assessment - 04/17/21 1106     Subjective Picking up his AFO from [employer] today for his RLE.    Pertinent History no significant PMH prior to ALS diagnosis    Limitations House hold activities;Standing;Walking    Diagnostic tests Cervical MRI:1. 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  2. Up to mild spinal stenosis at C4-5. Normal cord signal.  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    Patient Stated Goals Pt's goal is to be able to walk more comfortably and being able to maintain the strength that he has.    Currently in Pain? No/denies                             Access Code: [email or web address] URL: [email or web address] Date: 04/17/2021 Prepared by: Sherlie Ban  Initiated  HEP. See MedBridge for further details.   Exercises Supine Bridge - 2 x daily - 5 x weekly - 1-2 sets - 10 reps Gastroc Stretch on Wall - 1 x daily - 7 x weekly - 3 sets - 30 reps Seated Hamstring Stretch - 1 x daily - 5 x weekly - 3 sets - 30 hold Sit to Stand - 2 x daily - 5 x weekly - 1 sets - 10 reps -with hands on knees  Side Stepping with Resistance at Ankles and Counter Support - 1-2 x daily - 5 x weekly - 3 sets - with use of yellow tband around ankles.  Standing Marching - 1-2 x daily - 5 x weekly - 3 sets - with use of green tband around ankles.    [facility] Adult PT Treatment/Exercise - 04/17/21 1155       Ambulation/Gait   Ambulation/Gait Yes    Ambulation/Gait Assistance 5: Supervision    Ambulation/Gait Assistance Details R steppage pattern to clear RLE. Pt receiving his AFO from [employer] later today.    Assistive device Straight cane    Gait Pattern Step-through pattern;Decreased step length - right;Decreased stance time - right;Decreased weight shift to right;Poor foot clearance - right;Right steppage    Ambulation Surface Level;Indoor      Exercises   Exercises Other Exercises  Other Exercises  Seated Scifit at gear 1.5 for 5 minutes for endurance, strength, ROM.                     PT Education - 04/17/21 1153     Education Details Initial HEP to help maintain what strength he has/stretching    Person(s) Educated Patient    Methods Demonstration;Explanation;Handout    Comprehension Verbalized understanding;Returned demonstration              PT Short Term Goals - 04/13/21 1405       PT SHORT TERM GOAL #1   Title Pt will be able to complete 5x sit <> stand in under 10 seconds w/o UE assist to demonstrate a decreased fall risk.    Time 3    Period Weeks    Status New    Target Date 05/08/21      PT SHORT TERM GOAL #2   Title Pt will be able to ambulate at >/= 2.62 ft/s w/ LRAD for improved community ambulation and functional mobility.     Baseline 11/14- 13.09 sec= 2.51 ft/sec    Time 3    Period Weeks    Status New    Target Date 05/08/21      PT SHORT TERM GOAL #3   Title Pt will be able to ambulate 500' supervision over varied surfaces supervision, w/ LRAD for improved community ambulation and activity tolerance.    Time 3    Period Weeks    Status New    Target Date 05/08/21      PT SHORT TERM GOAL #4   Title Pt will be independent and educated on intial HEP for balance and BLE strength maintanence.    Time 3    Period Weeks    Status New    Target Date 05/08/21               PT Long Term Goals - 04/13/21 1414       PT LONG TERM GOAL #1   Title Pt will be able to independently donn and doff AFO and adhere to wear schedule demonstrating improved orthosis knowledge. (LTG due 05/29/21)    Time 6    Period Weeks    Status New    Target Date 05/29/21      PT LONG TERM GOAL #2   Title Pt will be indpendent w/ final HEP for improved balance and strength maintanence.    Time 6    Period Weeks    Status New    Target Date 05/29/21      PT LONG TERM GOAL #3   Title Pt will be able to ambulate >/= 1000' ft on varied surfaces supervision for improved community ambulation.    Time 6    Period Weeks    Status New    Target Date 05/29/21      PT LONG TERM GOAL #4   Title Pt will score >/= 50/56 on Berg Balance Scale demonstrating improved balance for decreased fall risk.    Baseline 11/14- 47/56    Time 6    Period Weeks    Status New    Target Date 05/29/21                   Plan - 04/17/21 1158     Clinical Impression Statement Initiated HEP today for stretching and to help maintain pt's strength and performed Scifit for endurance, ROM. Pt tolerated session well.  Will be receiving his R AFO from [employer] later today. Pt currently demonstrates R steppage gait for foot clearance.    Personal Factors and Comorbidities Time since onset of injury/illness/exacerbation    Examination-Activity  Limitations Squat;Stand;Stairs;Transfers;Lift;Locomotion Level    Examination-Participation Restrictions Psychologist, forensic;Yard Work;Shop    Stability/Clinical Decision Making Evolving/Moderate complexity    Rehab Potential Good    PT Frequency 2x / week   plus eval   PT Duration 6 weeks    PT Treatment/Interventions ADLs/Self Care Home Management;DME Instruction;Gait training;Stair training;Functional mobility training;Therapeutic activities;Therapeutic exercise;Balance training;Manual techniques;Orthotic Fit/Training;Patient/family education;Neuromuscular re-education;Passive range of motion    PT Next Visit Plan how is HEP? SciFit for ROM/endurance. functional BLE strength. Standing balance. Gait with new AFO.    PT Home Exercise Plan Will be provided next visit    Consulted and Agree with Plan of Care Patient             Patient will benefit from skilled therapeutic intervention in order to improve the following deficits and impairments:  Abnormal gait, Decreased activity tolerance, Decreased balance, Decreased range of motion, Decreased mobility, Decreased knowledge of use of DME, Decreased endurance, Decreased strength, Difficulty walking  Visit Diagnosis: Muscle weakness (generalized)  Other symptoms and signs involving the nervous system  Unsteadiness on feet     Problem List Patient Active Problem List   Diagnosis Date Noted   Amyotrophic lateral sclerosis (HCC) 03/27/2021   Achilles tendon pain 10/21/2020    Drake Leach, PT, DPT  04/17/2021, 11:59 AM  [facility] [address] [place], [place], [address] Phone: [phone]   Fax:  [phone]  Name: Matthew Rosario MRN: [id number] Date of Birth: 09-09-1965

## 2021-04-17 NOTE — Patient Instructions (Signed)
Access Code: [email or web address] URL: [email or web address] Date: 04/17/2021 Prepared by: Sherlie Ban  Exercises Supine Bridge - 2 x daily - 5 x weekly - 1-2 sets - 10 reps Gastroc Stretch on Wall - 1 x daily - 7 x weekly - 3 sets - 30 reps Seated Hamstring Stretch - 1 x daily - 5 x weekly - 3 sets - 30 hold Sit to Stand - 2 x daily - 5 x weekly - 1 sets - 10 reps Side Stepping with Resistance at Ankles and Counter Support - 1-2 x daily - 5 x weekly - 3 sets Standing Marching - 1-2 x daily - 5 x weekly - 3 sets

## 2021-04-17 NOTE — Telephone Encounter (Signed)
Referral to [facility] ALS clinic sent

## 2021-04-21 ENCOUNTER — Ambulatory Visit: Payer: 59 | Admitting: Occupational Therapy

## 2021-04-21 ENCOUNTER — Other Ambulatory Visit: Payer: Self-pay

## 2021-04-21 DIAGNOSIS — R278 Other lack of coordination: Secondary | ICD-10-CM

## 2021-04-21 DIAGNOSIS — R29818 Other symptoms and signs involving the nervous system: Secondary | ICD-10-CM

## 2021-04-21 DIAGNOSIS — R262 Difficulty in walking, not elsewhere classified: Secondary | ICD-10-CM | POA: Diagnosis not present

## 2021-04-21 DIAGNOSIS — R2681 Unsteadiness on feet: Secondary | ICD-10-CM

## 2021-04-21 DIAGNOSIS — M6281 Muscle weakness (generalized): Secondary | ICD-10-CM

## 2021-04-21 NOTE — Therapy (Signed)
[facility] [address] [place], [place], [address] Phone: [phone]   Fax:  [phone]  Occupational Therapy Treatment  Patient Details  Name: Matthew Rosario MRN: [id number] Date of Birth: 10/29/1965 Referring Provider (OT): Nita Sickle, DO   Encounter Date: 04/21/2021   OT End of Session - 04/21/21 1459     Visit Number 3    Number of Visits 25    Date for OT Re-Evaluation 07/06/21    Authorization Type [employer]    Authorization Time Period VL: 25 OT or $2000 max, 25 PT or $2000 max, ST not covered    Authorization - Number of Visits 25    OT Start Time 1145    OT Stop Time 1230    OT Time Calculation (min) 45 min    Activity Tolerance Patient tolerated treatment well    Behavior During Therapy [facility] for tasks assessed/performed             Past Medical History:  Diagnosis Date   Amyotrophic lateral sclerosis (HCC) 03/27/2021   Low testosterone     Past Surgical History:  Procedure Laterality Date   HERNIA REPAIR      There were no vitals filed for this visit.   Subjective Assessment - 04/21/21 1445     Subjective  Patient is wearing thumb splint, and reports ithelps with some tasks.    Pertinent History PMH: none    Limitations Fall Risk    Patient Stated Goals "better adapt to what may come" "typing"    Currently in Pain? No/denies                          OT Treatments/Exercises (OP) - 04/21/21 0001       ADLs   UB Dressing Patient reports no trouble with buttoning at this time, unless he needs to button top button - as if with a tie. Patient does not wear a tie to work.  Patient educated on option for button hook when needed - can revisit as warranted.  Encouraged patient to practice zipping and buttoning with brace on to help opposition of thumb and index.    LB Dressing Patient reports toes curling in shoe with attempts to put shoes on.  Patient has AFO in shoe and  in his attempts to step into shoe - foot position causes toe curling.  Worked on opening shoe fully, then using shoe and brace as shoe horn to scoop foot into shoe.  Practiced here without report of toe curling.    Cooking Patient reports difficulty cutting vegetables with brace on right hand.  Practiced holding various sized knives (as if chopping veggies, or slicing through steak) with and without brace.  Patient issued coban wrap to allow him to build up handles and provide a tacky surface easier to grip.  Patient shown adapted cutting board, and allowed to practice with use of pins to secure food in place.      Neurological Re-education Exercises   Other Exercises 1 Worked on pincer grasp and some manipulation skills with and without brace.  Patient is aware that he will opt to have brace on/off throughout the day based on needs of task.                    OT Education - 04/21/21 1454     Education Details rocker knife, built up foam, handles, adapted cutting board, adapted method to  don shoe/AFO, button hook    Person(s) Educated Patient    Methods Explanation;Demonstration;Verbal cues    Comprehension Verbalized understanding;Returned demonstration              OT Short Term Goals - 04/21/21 1501       OT SHORT TERM GOAL #1   Title Pt will be independent with HEP for coordination and grip strength    Time 4    Period Weeks    Status On-going    Target Date 05/11/21      OT SHORT TERM GOAL #2   Title Pt will verbalize understanding of adapted strategies and/or equipment for increasing independence and safety with ADLs and IADLs. (cutting vegetables, buttons, fasteners, shoes)    Time 4    Period Weeks    Status On-going      OT SHORT TERM GOAL #3   Title Pt will write 2-3 sentences with 90% legibility with adapted equipment PRN. (possibly built up grip)    Baseline approx 75% of baseline HW    Time 4    Period Weeks    Status On-going      OT SHORT TERM GOAL  #4   Title Pt will verbalize understanding of community resources for ALS    Time 4    Period Weeks    Status On-going               OT Long Term Goals - 04/21/21 1502       OT LONG TERM GOAL #1   Title Pt will verbalize and demonstrate understanding of any splints and/or orthoses wear and care PRN    Time 12    Period Weeks    Status On-going      OT LONG TERM GOAL #2   Title Pt will be independent with HEP for BUE ROM and stretches.    Time 12    Period Weeks    Status New      OT LONG TERM GOAL #3   Title Pt will increase coordination in RUE by completing 9 hole peg test in 45 seconds or less. (with use of splint PRN)    Time 12    Period Weeks    Status New      OT LONG TERM GOAL #4   Title Pt will verbalize understanding of adapted equipment and technology and strategies for work (typing, voice to text, etc)    Time 12    Period Weeks    Status New      OT LONG TERM GOAL #5   Title Pt will increase grip strength in RUE, dominant hand.    Baseline R 56.6, L 60.8    Time 12    Period Weeks    Status New                   Plan - 04/21/21 1500     Clinical Impression Statement Pt progressing with knowledge of potential future A/E needs, adapted techniques..    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 ADL;Strength;Decreased knowledge of use of DME;FMC;Sensation;Coordination;ROM;IADL;UE functional use;Dexterity;Balance;GMC    Rehab Potential Good    Clinical Decision Making Several treatment options, min-mod task modification necessary    Comorbidities Affecting Occupational Performance: May  have comorbidities impacting occupational performance    Modification  or Assistance to Complete Evaluation  Min-Moderate modification of tasks or assist with assess necessary to complete eval    OT Frequency 2x / week    OT Duration 12 weeks    OT Treatment/Interventions Aquatic Therapy;Self-care/ADL training;Fluidtherapy;DME and/or AE instruction;Splinting;Therapeutic activities;Ultrasound;Therapeutic exercise;Coping strategies training;Functional Mobility Training;Neuromuscular education;Cryotherapy;Paraffin;Energy conservation;Electrical Stimulation;Manual Therapy;Patient/family education    Plan A/E recommendations and task modifications (button hook, alternatives for cutting/chopping, etc). If time, show pt intrinsic hand movements (thumb palmer abd, intrinsic +) for HEP    Recommended Other Services recommended ST evaluation at least even though ST is not covered by insurance.    Consulted and Agree with Plan of Care Patient             Patient will benefit from skilled therapeutic intervention in order to improve the following deficits and impairments:   Body Structure / Function / Physical Skills: ADL, Strength, Decreased knowledge of use of DME, FMC, Sensation, Coordination, ROM, IADL, UE functional use, Dexterity, Balance, GMC       Visit Diagnosis: Muscle weakness (generalized)  Other symptoms and signs involving the nervous system  Other lack of coordination  Unsteadiness on feet    Problem List Patient Active Problem List   Diagnosis Date Noted   Amyotrophic lateral sclerosis (HCC) 03/27/2021   Achilles tendon pain 10/21/2020    Collier Salina, OT/L 04/21/2021, 3:03 PM  [facility] [address] [place], [place], [address] Phone: [phone]   Fax:  [phone]  Name: Matthew Rosario MRN: [id number] Date of Birth: 1966/05/08

## 2021-04-28 ENCOUNTER — Encounter: Payer: Self-pay | Admitting: Occupational Therapy

## 2021-04-28 ENCOUNTER — Other Ambulatory Visit: Payer: Self-pay

## 2021-04-28 ENCOUNTER — Ambulatory Visit: Payer: 59 | Admitting: Occupational Therapy

## 2021-04-28 DIAGNOSIS — M6281 Muscle weakness (generalized): Secondary | ICD-10-CM

## 2021-04-28 DIAGNOSIS — R262 Difficulty in walking, not elsewhere classified: Secondary | ICD-10-CM | POA: Diagnosis not present

## 2021-04-28 DIAGNOSIS — R2681 Unsteadiness on feet: Secondary | ICD-10-CM

## 2021-04-28 DIAGNOSIS — R29818 Other symptoms and signs involving the nervous system: Secondary | ICD-10-CM

## 2021-04-28 DIAGNOSIS — R278 Other lack of coordination: Secondary | ICD-10-CM

## 2021-04-28 NOTE — Therapy (Signed)
[facility] [address] [place], [place], [address] Phone: [phone]   Fax:  [phone]  Occupational Therapy Treatment  Patient Details  Name: Matthew Rosario MRN: [id number] Date of Birth: April 09, 1966 Referring Provider (OT): Nita Sickle, DO   Encounter Date: 04/28/2021   OT End of Session - 04/28/21 1448     Visit Number 4    Number of Visits 25    Date for OT Re-Evaluation 07/06/21    Authorization Type [employer]    Authorization Time Period VL: 25 OT or $2000 max, 25 PT or $2000 max, ST not covered    Authorization - Number of Visits 25    OT Start Time 1445    OT Stop Time 1530    OT Time Calculation (min) 45 min    Activity Tolerance Patient tolerated treatment well    Behavior During Therapy [facility] for tasks assessed/performed             Past Medical History:  Diagnosis Date   Amyotrophic lateral sclerosis (HCC) 03/27/2021   Low testosterone     Past Surgical History:  Procedure Laterality Date   HERNIA REPAIR      There were no vitals filed for this visit.   Subjective Assessment - 04/28/21 1447     Subjective  "Doesn't take a lot to make me tired"    Pertinent History PMH: none    Limitations Fall Risk    Patient Stated Goals "better adapt to what may come" "typing"    Currently in Pain? No/denies                          OT Treatments/Exercises (OP) - 04/28/21 1500       Exercises   Exercises Hand      Hand Exercises   Other Hand Exercises palmar abduction x 10, intrinsic plus x 10 (RUE)      Fine Motor Coordination (Hand/Wrist)   Fine Motor Coordination Handwriting    Handwriting practicing with different grips and pens. pt with most success with foam grip (brown)                    OT Education - 04/28/21 1523     Education Details seated/standing dowel exercises HEP, Access Code: [email or web address]    Person(s) Educated Patient    Methods  Explanation;Demonstration;Handout    Comprehension Verbalized understanding;Returned demonstration              OT Short Term Goals - 04/21/21 1501       OT SHORT TERM GOAL #1   Title Pt will be independent with HEP for coordination and grip strength    Time 4    Period Weeks    Status On-going    Target Date 05/11/21      OT SHORT TERM GOAL #2   Title Pt will verbalize understanding of adapted strategies and/or equipment for increasing independence and safety with ADLs and IADLs. (cutting vegetables, buttons, fasteners, shoes)    Time 4    Period Weeks    Status On-going      OT SHORT TERM GOAL #3   Title Pt will write 2-3 sentences with 90% legibility with adapted equipment PRN. (possibly built up grip)    Baseline approx 75% of baseline HW    Time 4    Period Weeks    Status On-going      OT SHORT TERM GOAL #  4   Title Pt will verbalize understanding of community resources for ALS    Time 4    Period Weeks    Status On-going               OT Long Term Goals - 04/21/21 1502       OT LONG TERM GOAL #1   Title Pt will verbalize and demonstrate understanding of any splints and/or orthoses wear and care PRN    Time 12    Period Weeks    Status On-going      OT LONG TERM GOAL #2   Title Pt will be independent with HEP for BUE ROM and stretches.    Time 12    Period Weeks    Status New      OT LONG TERM GOAL #3   Title Pt will increase coordination in RUE by completing 9 hole peg test in 45 seconds or less. (with use of splint PRN)    Time 12    Period Weeks    Status New      OT LONG TERM GOAL #4   Title Pt will verbalize understanding of adapted equipment and technology and strategies for work (typing, voice to text, etc)    Time 12    Period Weeks    Status New      OT LONG TERM GOAL #5   Title Pt will increase grip strength in RUE, dominant hand.    Baseline R 56.6, L 60.8    Time 12    Period Weeks    Status New                    Plan - 04/28/21 1531     Clinical Impression Statement Pt continues to progress with goals and is motivated to slow progressing and maintain skills as long as possible with adapted strategies and equipment. Pt is planning to go to ALS clinic in [facility] soon for his first appt.    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 ADL;Strength;Decreased knowledge of use of DME;FMC;Sensation;Coordination;ROM;IADL;UE functional use;Dexterity;Balance;GMC    Rehab Potential Good    Clinical Decision Making Several treatment options, min-mod task modification necessary    Comorbidities Affecting Occupational Performance: May have comorbidities impacting occupational performance    Modification or Assistance to Complete Evaluation  Min-Moderate modification of tasks or assist with assess necessary to complete eval    OT Frequency 2x / week    OT Duration 12 weeks    OT Treatment/Interventions Aquatic Therapy;Self-care/ADL training;Fluidtherapy;DME and/or AE instruction;Splinting;Therapeutic activities;Ultrasound;Therapeutic exercise;Coping strategies training;Functional Mobility Training;Neuromuscular education;Cryotherapy;Paraffin;Energy conservation;Electrical Stimulation;Manual Therapy;Patient/family education    Plan continue to address and adapted equipment - demonstrate button hook if found, coordination RUE    Recommended Other Services recommended ST evaluation at least even though ST is not covered by insurance.    Consulted and Agree with Plan of Care Patient             Patient will benefit from skilled therapeutic intervention in order to improve the following deficits and impairments:    Body Structure / Function / Physical Skills: ADL, Strength, Decreased knowledge of use of DME, FMC, Sensation, Coordination, ROM, IADL, UE functional use, Dexterity, Balance, GMC  Visit Diagnosis: Muscle weakness (generalized)  Other lack of coordination  Unsteadiness on feet  Other symptoms and signs involving the nervous system    Problem List Patient Active Problem List   Diagnosis Date Noted   Amyotrophic lateral sclerosis (HCC) 03/27/2021   Achilles tendon pain 10/21/2020    Junious Dresser, OT/L 04/28/2021, 3:33 PM  [facility] [address] [place], [place], [address] Phone: [phone]   Fax:  [phone]  Name: Matthew Rosario MRN: [id number] Date of Birth: September 04, 1965

## 2021-04-28 NOTE — Patient Instructions (Addendum)
Access Code: [email or web address] URL: [email or web address] Date: 04/28/2021 Prepared by: Kallie Edward  Exercises Seated Shoulder Flexion Towel Slide at Table Top - 1 x daily - 7 x weekly - 3 sets - 10 reps Standing Shoulder Internal Rotation Stretch with Towel - 1 x daily - 7 x weekly - 3 sets - 10 reps Standing Shoulder Extension with Dowel - 1 x daily - 7 x weekly - 3 sets - 10 reps Standing Shoulder Flexion AAROM with Dowel - 1 x daily - 7 x weekly - 3 sets - 10 reps Seated Shoulder Abduction AAROM with Dowel - 1 x daily - 7 x weekly - 3 sets - 10 reps Standing Shoulder External Internal Rotation AAROM with Dowel - 1 x daily - 7 x weekly - 3 sets - 10 reps   AROM: Thumb Abduction / Adduction    Actively pull right thumb away from palm as far as possible. Hold ____ seconds. Then bring thumb back to touch fingers. Try not to bend fingers toward thumb. Repeat ____ times per set. Do ____ sets per session. Do ____ sessions per day.   MP Flexion (Active)   With back of hand on table, bend large knuckles as far as they will go, keeping small joints straight. Repeat _10-15___ times. Do __4-6__ sessions per day. Activity: Reach into a narrow container.*     Copyright  VHI. All rights reserved.    Copyright  VHI. All rights reserved.

## 2021-05-01 ENCOUNTER — Ambulatory Visit: Payer: 59 | Admitting: Occupational Therapy

## 2021-05-01 ENCOUNTER — Ambulatory Visit: Payer: 59 | Admitting: Physical Therapy

## 2021-05-04 ENCOUNTER — Ambulatory Visit: Payer: 59 | Admitting: Occupational Therapy

## 2021-05-04 ENCOUNTER — Ambulatory Visit: Payer: 59 | Attending: Neurology | Admitting: Physical Therapy

## 2021-05-04 ENCOUNTER — Other Ambulatory Visit: Payer: Self-pay

## 2021-05-04 ENCOUNTER — Encounter: Payer: Self-pay | Admitting: Physical Therapy

## 2021-05-04 ENCOUNTER — Encounter: Payer: Self-pay | Admitting: Occupational Therapy

## 2021-05-04 DIAGNOSIS — M21371 Foot drop, right foot: Secondary | ICD-10-CM | POA: Insufficient documentation

## 2021-05-04 DIAGNOSIS — R29818 Other symptoms and signs involving the nervous system: Secondary | ICD-10-CM

## 2021-05-04 DIAGNOSIS — M6281 Muscle weakness (generalized): Secondary | ICD-10-CM | POA: Insufficient documentation

## 2021-05-04 DIAGNOSIS — R278 Other lack of coordination: Secondary | ICD-10-CM

## 2021-05-04 DIAGNOSIS — R2689 Other abnormalities of gait and mobility: Secondary | ICD-10-CM | POA: Insufficient documentation

## 2021-05-04 DIAGNOSIS — R2681 Unsteadiness on feet: Secondary | ICD-10-CM | POA: Insufficient documentation

## 2021-05-04 DIAGNOSIS — R262 Difficulty in walking, not elsewhere classified: Secondary | ICD-10-CM | POA: Diagnosis present

## 2021-05-04 NOTE — Therapy (Addendum)
[facility] [address] [place], [place], [address] Phone: [phone]   Fax:  [phone]  Physical Therapy Treatment  Patient Details  Name: Matthew Rosario MRN: [id number] Date of Birth: [birth year] No data recorded  Encounter Date: 05/04/2021   PT End of Session - 05/04/21 1405     Visit Number 3    Number of Visits 13    Date for PT Re-Evaluation 05/29/21    Authorization Type [employer] (25 VL - PT)    PT Start Time 1403    PT Stop Time 1442    PT Time Calculation (min) 39 min    Equipment Utilized During Treatment Gait belt    Activity Tolerance Patient tolerated treatment well    Behavior During Therapy WFL for tasks assessed/performed             Past Medical History:  Diagnosis Date   Amyotrophic lateral sclerosis (HCC) 03/27/2021   Low testosterone     Past Surgical History:  Procedure Laterality Date   HERNIA REPAIR      There were no vitals filed for this visit.   Subjective Assessment - 05/04/21 1405     Subjective Got his AFO from [employer]. Going well and no longer using his cane. Reports his balance is feeling better. Exercises are going good, but hasn't been doing them as much due to being busy with work and the holidays.    Pertinent History no significant PMH prior to ALS diagnosis    Limitations House hold activities;Standing;Walking    Diagnostic tests Cervical MRI:1. 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  2. Up to mild spinal stenosis at C4-5. Normal cord signal.  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    Patient Stated Goals Pt's goal is to be able to walk more comfortably and being able to maintain the strength that he has.     Currently in Pain? No/denies                               [facility] Adult PT Treatment/Exercise - 05/04/21 1426       Ambulation/Gait   Ambulation/Gait Yes    Ambulation/Gait Assistance 5: Supervision    Ambulation/Gait Assistance Details With no AD, working on head turns/nods, needing intermittent min guard, more with turning head to R and looking up. Pt wering new R PLS AFO throughout session.    Ambulation Distance (Feet) 230 Feet    Assistive device None    Gait Pattern Step-through pattern;Decreased step length - right;Decreased stance time - right;Decreased weight shift to right;Poor foot clearance - right;Right steppage    Ambulation Surface Level;Indoor      High Level Balance   High Level Balance Activities Negotiating over obstacles    High Level Balance Comments Over 30': stepping over 4 obstacles ranging from 4-6" and with 2 cones alterating SLS taps and stepping over, performed down and back x2 reps, incr difficulty with SLS taps, needing min guard at times for balance. With incr reps pt able to maintain gait speed when stepping over obstacles vs. slowing down to step over                 Balance Exercises -  05/04/21 1430       Balance Exercises: Standing   Standing Eyes Opened Limitations    Standing Eyes Opened Limitations Level ground, feet together 2 x 5 reps head turns, 2 x 5 reps head turns    SLS with Vectors Solid surface;Limitations    SLS with Vectors Limitations Alternating SLS taps x10 reps each leg with UE support > none, then cross body taps x10 reps. Cues for glute activation for SLS tasks    Rockerboard Anterior/posterior;Limitations    Rockerboard Limitations Weight shifting with BUE support x15 reps- cues for proper technique and slowed pace, keeping board steady alternating UE lifts x5 reps each side    Step Ups Forward;6 inch;UE support 2;Limitations    Step Ups Limitations x5 reps each side and floating non stance leg into  a march with BUE support, x5 reps each side alternating legs step up/up and down/down with working on decr UE support                  PT Short Term Goals - 04/13/21 1405       PT SHORT TERM GOAL #1   Title Pt will be able to complete 5x sit <> stand in under 10 seconds w/o UE assist to demonstrate a decreased fall risk.    Time 3    Period Weeks    Status New    Target Date 05/08/21      PT SHORT TERM GOAL #2   Title Pt will be able to ambulate at >/= 2.62 ft/s w/ LRAD for improved community ambulation and functional mobility.    Baseline 11/14- 13.09 sec= 2.51 ft/sec    Time 3    Period Weeks    Status New    Target Date 05/08/21      PT SHORT TERM GOAL #3   Title Pt will be able to ambulate 500' supervision over varied surfaces supervision, w/ LRAD for improved community ambulation and activity tolerance.    Time 3    Period Weeks    Status New    Target Date 05/08/21      PT SHORT TERM GOAL #4   Title Pt will be independent and educated on intial HEP for balance and BLE strength maintanence.    Time 3    Period Weeks    Status New    Target Date 05/08/21               PT Long Term Goals - 04/13/21 1414       PT LONG TERM GOAL #1   Title Pt will be able to independently donn and doff AFO and adhere to wear schedule demonstrating improved orthosis knowledge. (LTG due 05/29/21)    Time 6    Period Weeks    Status New    Target Date 05/29/21      PT LONG TERM GOAL #2   Title Pt will be indpendent w/ final HEP for improved balance and strength maintanence.    Time 6    Period Weeks    Status New    Target Date 05/29/21      PT LONG TERM GOAL #3   Title Pt will be able to ambulate >/= 1000' ft on varied surfaces supervision for improved community ambulation.    Time 6    Period Weeks    Status New    Target Date 05/29/21      PT LONG TERM GOAL #4   Title Pt will  score >/= 50/56 on Berg Balance Scale demonstrating improved balance for decreased  fall risk.    Baseline 11/14- 47/56    Time 6    Period Weeks    Status New    Target Date 05/29/21                   Plan - 05/04/21 1641     Clinical Impression Statement Pt received new R PLS AFO since last visit and able to ambulate with no AD with supervision. When performing obstacle/SLS tasks and some head motions, pt does need min guard at times.However, does improve with incr reps. Pt challenged by SLS activities today on RLE. Will continue to progress towards LTGs.    Personal Factors and Comorbidities Time since onset of injury/illness/exacerbation    Examination-Activity Limitations Squat;Stand;Stairs;Transfers;Lift;Locomotion Level    Examination-Participation Restrictions Psychologist, forensic;Yard Work;Shop    Stability/Clinical Decision Making Evolving/Moderate complexity    Rehab Potential Good    PT Frequency 2x / week   plus eval   PT Duration 6 weeks    PT Treatment/Interventions ADLs/Self Care Home Management;DME Instruction;Gait training;Stair training;Functional mobility training;Therapeutic activities;Therapeutic exercise;Balance training;Manual techniques;Orthotic Fit/Training;Patient/family education;Neuromuscular re-education;Passive range of motion    PT Next Visit Plan how is HEP? add balance as needed. SciFit for ROM/endurance. functional BLE strength. Standing balance - unlevel surfaces, head motions, SLS. Dynamic Gait with new AFO.    PT Home Exercise Plan Will be provided next visit    Consulted and Agree with Plan of Care Patient             Patient will benefit from skilled therapeutic intervention in order to improve the following deficits and impairments:  Abnormal gait, Decreased activity tolerance, Decreased balance, Decreased range of motion, Decreased mobility, Decreased knowledge of use of DME, Decreased endurance, Decreased strength, Difficulty walking  Visit Diagnosis: Muscle weakness (generalized)  Unsteadiness on  feet  Other symptoms and signs involving the nervous system     Problem List Patient Active Problem List   Diagnosis Date Noted   Amyotrophic lateral sclerosis (HCC) 03/27/2021   Achilles tendon pain 10/21/2020    Drake Leach, PT, DPT  05/04/2021, 4:42 PM   [facility] [address] [place], [place], [address] Phone: [phone]   Fax:  [phone]  Name: LEONIDES MINDER MRN: [id number] Date of Birth: March 02, 1966

## 2021-05-04 NOTE — Patient Instructions (Signed)
Access Code: [email or web address] URL: [email or web address] Date: 05/04/2021 Prepared by: Kallie Edward  Exercises Putty Squeezes - 1 x daily - 7 x weekly - 3 sets - 10 reps Rolling Putty on Table - 1 x daily - 7 x weekly - 3 sets - 10 reps Thumb Opposition with Putty - 1 x daily - 7 x weekly - 3 sets - 10 reps Seated Finger MP Flexion with Putty - 1 x daily - 7 x weekly - 3 sets - 10 reps Thumb Palmar Abduction with Putty Loop - 1 x daily - 7 x weekly - 3 sets - 10 reps Seated Claw Fist with Putty - 1 x daily - 7 x weekly - 3 sets - 10 reps

## 2021-05-04 NOTE — Therapy (Signed)
[facility] [address] [place], [place], [address] Phone: [phone]   Fax:  [phone]  Occupational Therapy Treatment  Patient Details  Name: Matthew Rosario MRN: [id number] Date of Birth: 1965/07/24 Referring Provider (OT): Nita Sickle, DO   Encounter Date: 05/04/2021   OT End of Session - 05/04/21 1443     Visit Number 5    Number of Visits 25    Date for OT Re-Evaluation 07/06/21    Authorization Type [employer]    Authorization Time Period VL: 25 OT or $2000 max, 25 PT or $2000 max, ST not covered    Authorization - Number of Visits 25    OT Start Time 1443    OT Stop Time 1530    OT Time Calculation (min) 47 min    Activity Tolerance Patient tolerated treatment well    Behavior During Therapy [facility] for tasks assessed/performed             Past Medical History:  Diagnosis Date   Amyotrophic lateral sclerosis (HCC) 03/27/2021   Low testosterone     Past Surgical History:  Procedure Laterality Date   HERNIA REPAIR      There were no vitals filed for this visit.   Subjective Assessment - 05/04/21 1443     Subjective  "pretty good except the world cup"    Pertinent History PMH: none    Limitations Fall Risk    Patient Stated Goals "better adapt to what may come" "typing"    Currently in Pain? No/denies                          OT Treatments/Exercises (OP) - 05/04/21 1524       Fine Motor Coordination (Hand/Wrist)   Fine Motor Coordination Large Pegboard    Large Pegboard Medium Pegs with RUE with min difficulty and minimal drops. Pt wore short opponens splint for activity with increased ease                    OT Education - 05/04/21 1517     Education Details issued yellow theraputty and red but instructed to use yellow at this time. see pt instructions. Unable to print d/t technical difficulties.    Person(s) Educated Patient    Methods  Explanation;Demonstration;Handout    Comprehension Verbalized understanding;Returned demonstration              OT Short Term Goals - 05/04/21 1446       OT SHORT TERM GOAL #1   Title Pt will be independent with HEP for coordination and grip strength    Time 4    Period Weeks    Status On-going    Target Date 05/11/21      OT SHORT TERM GOAL #2   Title Pt will verbalize understanding of adapted strategies and/or equipment for increasing independence and safety with ADLs and IADLs. (cutting vegetables, buttons, fasteners, shoes)    Time 4    Period Weeks    Status On-going      OT SHORT TERM GOAL #3   Title Pt will write 2-3 sentences with 90% legibility with adapted equipment PRN. (possibly built up grip)    Baseline approx 75% of baseline HW    Time 4    Period Weeks    Status On-going      OT SHORT TERM GOAL #4   Title Pt will verbalize understanding of community  resources for ALS    Time 4    Period Weeks    Status Achieved               OT Long Term Goals - 05/04/21 1444       OT LONG TERM GOAL #1   Title Pt will verbalize and demonstrate understanding of any splints and/or orthoses wear and care PRN    Time 12    Period Weeks    Status On-going      OT LONG TERM GOAL #2   Title Pt will be independent with HEP for BUE ROM and stretches.    Time 12    Period Weeks    Status New      OT LONG TERM GOAL #3   Title Pt will increase coordination in RUE by completing 9 hole peg test in 45 seconds or less. (with use of splint PRN)    Time 12    Period Weeks    Status New      OT LONG TERM GOAL #4   Title Pt will verbalize understanding of adapted equipment and technology and strategies for work (typing, voice to text, etc)    Time 12    Period Weeks    Status New      OT LONG TERM GOAL #5   Title Pt will increase grip strength in RUE, dominant hand.    Baseline R 56.6, L 60.8    Time 12    Period Weeks    Status New                    Plan - 05/04/21 1525     Clinical Impression Statement Pt has appt 12/21 @ ALS clinic in [place]. Pt motivated to slow progression of condition and maintain level of strength and skill as long as possible.    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 ADL;Strength;Decreased knowledge of use of DME;FMC;Sensation;Coordination;ROM;IADL;UE functional use;Dexterity;Balance;GMC    Rehab Potential Good    Clinical Decision Making Several treatment options, min-mod task modification necessary    Comorbidities Affecting Occupational Performance: May have comorbidities impacting occupational performance    Modification or Assistance to Complete Evaluation  Min-Moderate modification of tasks or assist with assess necessary to complete eval    OT Frequency 2x / week    OT Duration 12 weeks    OT Treatment/Interventions Aquatic Therapy;Self-care/ADL training;Fluidtherapy;DME and/or AE instruction;Splinting;Therapeutic activities;Ultrasound;Therapeutic exercise;Coping strategies training;Functional Mobility Training;Neuromuscular education;Cryotherapy;Paraffin;Energy conservation;Electrical Stimulation;Manual Therapy;Patient/family education    Plan continue to address and adapted equipment - demonstrate button hook if found, coordination RUE    Recommended Other Services recommended ST evaluation at least even though ST is not covered by insurance.    Consulted and Agree with Plan of Care Patient             Patient will benefit from skilled therapeutic intervention in order to improve the following deficits and impairments:   Body Structure / Function / Physical Skills: ADL, Strength, Decreased knowledge  of use of DME, FMC, Sensation, Coordination, ROM, IADL, UE functional use, Dexterity, Balance, GMC       Visit Diagnosis: Muscle weakness (generalized)  Unsteadiness on feet  Other symptoms and signs involving the nervous system  Other lack  of coordination  Other abnormalities of gait and mobility    Problem List Patient Active Problem List   Diagnosis Date Noted   Amyotrophic lateral sclerosis (HCC) 03/27/2021   Achilles tendon pain 10/21/2020    Junious Dresser, OT 05/04/2021, 3:54 PM  [facility] [address] [place], [place], [address] Phone: [phone]   Fax:  [phone]  Name: Matthew Rosario MRN: [id number] Date of Birth: 1965/08/20

## 2021-05-06 ENCOUNTER — Ambulatory Visit: Payer: 59 | Admitting: Occupational Therapy

## 2021-05-06 ENCOUNTER — Encounter: Payer: Self-pay | Admitting: Occupational Therapy

## 2021-05-06 ENCOUNTER — Other Ambulatory Visit: Payer: Self-pay

## 2021-05-06 ENCOUNTER — Encounter: Payer: Self-pay | Admitting: Physical Therapy

## 2021-05-06 ENCOUNTER — Ambulatory Visit: Payer: 59 | Admitting: Physical Therapy

## 2021-05-06 DIAGNOSIS — R2681 Unsteadiness on feet: Secondary | ICD-10-CM

## 2021-05-06 DIAGNOSIS — M6281 Muscle weakness (generalized): Secondary | ICD-10-CM | POA: Diagnosis not present

## 2021-05-06 DIAGNOSIS — R29818 Other symptoms and signs involving the nervous system: Secondary | ICD-10-CM

## 2021-05-06 DIAGNOSIS — R278 Other lack of coordination: Secondary | ICD-10-CM

## 2021-05-06 NOTE — Patient Instructions (Signed)
Access Code: [email or web address] URL: [email or web address] Date: 05/06/2021 Prepared by: Sherlie Ban  Exercises Supine Bridge - 2 x daily - 5 x weekly - 1-2 sets - 10 reps Gastroc Stretch on Wall - 1 x daily - 7 x weekly - 3 sets - 30 reps Seated Hamstring Stretch - 1 x daily - 5 x weekly - 3 sets - 30 hold Sit to Stand - 2 x daily - 5 x weekly - 1 sets - 10 reps Side Stepping with Resistance at Ankles and Counter Support - 1-2 x daily - 5 x weekly - 3 sets Standing Marching - 1-2 x daily - 5 x weekly - 3 sets Standing Single Leg Stance with Counter Support - 2 x daily - 5 x weekly - 3 sets - 10 hold Standing with Head Rotation - 1 x daily - 5 x weekly - 2 sets - 10 reps

## 2021-05-06 NOTE — Therapy (Signed)
[facility] [address] [place], [place], [address] Phone: [phone]   Fax:  [phone]  Physical Therapy Treatment  Patient Details  Name: SAFI CULOTTA MRN: [id number] Date of Birth: Jul 12, 1965 No data recorded  Encounter Date: 05/06/2021   PT End of Session - 05/06/21 1234     Visit Number 4    Number of Visits 13    Date for PT Re-Evaluation 05/29/21    Authorization Type [employer] (25 VL - PT)    PT Start Time 1232    PT Stop Time 1315    PT Time Calculation (min) 43 min    Equipment Utilized During Treatment Gait belt    Activity Tolerance Patient tolerated treatment well    Behavior During Therapy WFL for tasks assessed/performed             Past Medical History:  Diagnosis Date   Amyotrophic lateral sclerosis ([place]) 03/27/2021   Low testosterone     Past Surgical History:  Procedure Laterality Date   HERNIA REPAIR      There were no vitals filed for this visit.   Subjective Assessment - 05/06/21 1234     Subjective Feeling a little bit more sore today due to the bad weather.    Pertinent History no significant PMH prior to ALS diagnosis    Limitations House hold activities;Standing;Walking    Diagnostic tests Cervical MRI:1. 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  2. Up to mild spinal stenosis at C4-5. Normal cord signal.  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    Patient Stated Goals Pt's goal is to be able to walk more comfortably and being able to maintain the strength that he has.    Currently in Pain? No/denies                               [facility] Adult PT Treatment/Exercise - 05/06/21 1235        Transfers   Transfers Sit to Stand;Stand to Sit    Sit to Stand 5: Supervision    Five time sit to stand comments  10.53 seconds with UE on legs    Stand to Sit 5: Supervision      Ambulation/Gait   Ambulation/Gait Yes    Ambulation/Gait Assistance 5: Supervision    Ambulation/Gait Assistance Details With no AD and R AFO between activities    Assistive device None    Gait Pattern Step-through pattern;Decreased step length - right;Decreased stance time - right;Decreased weight shift to right;Poor foot clearance - right;Right steppage    Ambulation Surface Level;Indoor    Gait velocity 12.06 seconds = 2.72 ft/sec      Exercises   Exercises Other Exercises    Other Exercises  Pt asking about performing a quad stretch, performed in standing at countertop with single UE support and then performed sidelying quad stretch, cues to hold stretch for 30 seconds. Due to position and feeling of stretch, pt prefers one in sidelying.                 Balance Exercises - 05/06/21 1309       Balance Exercises: Standing   Standing Eyes Opened Narrow base of support (BOS);Foam/compliant  surface    Standing Eyes Opened Limitations On air ex; with feet hip width x10 reps head turns, x10 reps head nods. With feet together 2 x 5 reps head turns, 2 x 5 reps head nods. Pt needing intermittent taps to walls    Standing Eyes Closed Wide (BOA);Foam/compliant surface;4 reps;30 secs;Limitations    Standing Eyes Closed Limitations On air ex wider BOS 2x30 seconds, feet hip width 2 x 30 seconds with incr postural sway    SLS with Vectors Solid surface;Limitations    SLS with Vectors Limitations At bottom of stair case - alternating foot taps to 2nd step x10 reps each side, then forward/cross body tap x10 reps each side, working on decr UE support    Step Ups Forward;6 inch;UE support 2;Limitations    Step Ups Limitations 2 x 5 reps each side alternating legs step up/up and down/down with working on decr UE  support    Tandem Gait Forward;Limitations    Tandem Gait Limitations down and back x3 reps at countertop with fingertip support            Access Code: [email or web address] URL: [email or web address] Date: 05/06/2021 Prepared by: Janann August  Exercises Supine Bridge - 2 x daily - 5 x weekly - 1-2 sets - 10 reps Gastroc Stretch on Wall - 1 x daily - 7 x weekly - 3 sets - 30 reps Seated Hamstring Stretch - 1 x daily - 5 x weekly - 3 sets - 30 hold Sit to Stand - 2 x daily - 5 x weekly - 1 sets - 10 reps Side Stepping with Resistance at Ankles and Counter Support - 1-2 x daily - 5 x weekly - 3 sets Standing Marching - 1-2 x daily - 5 x weekly - 3 sets   New additions on 05/06/21 Standing Single Leg Stance with Counter Support - 2 x daily - 5 x weekly - 3 sets - 10 hold Standing with Head Rotation - 1 x daily - 5 x weekly - 2 sets - 10 reps - standing on 2 pillows with feet apart 2 x 10 reps head turns, 2 x 10 reps head nods    PT Education - 05/06/21 1316     Education Details Balance additions to HEP    Person(s) Educated Patient              PT Short Term Goals - 05/06/21 1237       PT SHORT TERM GOAL #1   Title Pt will be able to complete 5x sit <> stand in under 10 seconds w/o UE assist to demonstrate a decreased fall risk.    Baseline 10.53 seconds with UE on legs    Time 3    Period Weeks    Status Not Met    Target Date 05/08/21      PT SHORT TERM GOAL #2   Title Pt will be able to ambulate at >/= 2.62 ft/s w/ LRAD for improved community ambulation and functional mobility.    Baseline 11/14- 13.09 sec= 2.51 ft/sec; 12.06 seconds = 2.72 ft/sec with R AFO and no AD    Time 3    Period Weeks    Status Achieved    Target Date 05/08/21      PT SHORT TERM GOAL #3   Title Pt will be able to ambulate 500' supervision over varied surfaces supervision, w/ LRAD for improved community ambulation and activity tolerance.    Time 3    Period Weeks  Status  New    Target Date 05/08/21      PT SHORT TERM GOAL #4   Title Pt will be independent and educated on intial HEP for balance and BLE strength maintanence.    Baseline pt independent with initial strengthening HEP, added balance on 05/06/21    Time 3    Period Weeks    Status Achieved    Target Date 05/08/21               PT Long Term Goals - 04/13/21 1414       PT LONG TERM GOAL #1   Title Pt will be able to independently donn and doff AFO and adhere to wear schedule demonstrating improved orthosis knowledge. (LTG due 05/29/21)    Time 6    Period Weeks    Status New    Target Date 05/29/21      PT LONG TERM GOAL #2   Title Pt will be indpendent w/ final HEP for improved balance and strength maintanence.    Time 6    Period Weeks    Status New    Target Date 05/29/21      PT LONG TERM GOAL #3   Title Pt will be able to ambulate >/= 1000' ft on varied surfaces supervision for improved community ambulation.    Time 6    Period Weeks    Status New    Target Date 05/29/21      PT LONG TERM GOAL #4   Title Pt will score >/= 50/56 on Berg Balance Scale demonstrating improved balance for decreased fall risk.    Baseline 11/14- 47/56    Time 6    Period Weeks    Status New    Target Date 05/29/21                   Plan - 05/06/21 1637     Clinical Impression Statement Pt met STG #2 today in regards to gait speed. Performed with no AD and R AFO in 2.72 ft/sec, previously was 2.51 ft/sec with cane and no AFO. Indicating now that pt is a limited community ambulator. Pt improved 5x sit <> stand time to 10.53 seconds, but not quite to goal level. Today's session worked on maintaining pt's functional strength, SLS, and balance on compliant surfaces. Added balance exercises to pt's HEP. Will continue to progress towards LTGs.    Personal Factors and Comorbidities Time since onset of injury/illness/exacerbation    Examination-Activity Limitations  Squat;Stand;Stairs;Transfers;Lift;Locomotion Level    Examination-Participation Restrictions Art gallery manager;Yard Work;Shop    Stability/Clinical Decision Making Evolving/Moderate complexity    Rehab Potential Good    PT Frequency 2x / week   plus eval   PT Duration 6 weeks    PT Treatment/Interventions ADLs/Self Care Home Management;DME Instruction;Gait training;Stair training;Functional mobility training;Therapeutic activities;Therapeutic exercise;Balance training;Manual techniques;Orthotic Fit/Training;Patient/family education;Neuromuscular re-education;Passive range of motion    PT Next Visit Plan how is HEP? check last STG. SciFit for ROM/endurance. functional BLE strength. Standing balance - unlevel surfaces, head motions, SLS. Dynamic Gait with new AFO.    PT Home Exercise Plan Will be provided next visit    Consulted and Agree with Plan of Care Patient             Patient will benefit from skilled therapeutic intervention in order to improve the following deficits and impairments:  Abnormal gait, Decreased activity tolerance, Decreased balance, Decreased range of motion, Decreased mobility, Decreased knowledge of use of DME,  Decreased endurance, Decreased strength, Difficulty walking  Visit Diagnosis: Muscle weakness (generalized)  Unsteadiness on feet  Other symptoms and signs involving the nervous system     Problem List Patient Active Problem List   Diagnosis Date Noted   Amyotrophic lateral sclerosis ([place]) 03/27/2021   Achilles tendon pain 10/21/2020    Arliss Journey, PT, DPT  05/06/2021, 4:39 PM  [facility] [address] [place], [place], [address] Phone: [phone]   Fax:  [phone]  Name: QUINTON VOTH MRN: [id number] Date of Birth: April 30, 1966

## 2021-05-06 NOTE — Therapy (Signed)
[facility] [address] [place], [place], [address] Phone: [phone]   Fax:  [phone]  Occupational Therapy Treatment  Patient Details  Name: JAKOBI THETFORD MRN: [id number] Date of Birth: Aug 30, 1965 Referring Provider (OT): Nita Sickle, DO   Encounter Date: 05/06/2021   OT End of Session - 05/06/21 1650     Visit Number 6    Number of Visits 25    Date for OT Re-Evaluation 07/06/21    Authorization Type [employer]    Authorization Time Period VL: 25 OT or $2000 max, 25 PT or $2000 max, ST not covered    Authorization - Number of Visits 25    OT Start Time 1320    OT Stop Time 1405    OT Time Calculation (min) 45 min    Activity Tolerance Patient tolerated treatment well    Behavior During Therapy [facility] for tasks assessed/performed             Past Medical History:  Diagnosis Date   Amyotrophic lateral sclerosis ([facility]) 03/27/2021   Low testosterone     Past Surgical History:  Procedure Laterality Date   HERNIA REPAIR      There were no vitals filed for this visit.   Subjective Assessment - 05/06/21 1643     Subjective  Patient reports he is scheduled to attend ALS clinic in two weeks    Pertinent History PMH: none    Limitations Fall Risk    Patient Stated Goals "better adapt to what may come" "typing"    Currently in Pain? No/denies                          OT Treatments/Exercises (OP) - 05/06/21 1645       Hand Exercises   Other Hand Exercises Attempted digi extend to increase digit extension, unable to use thumb effectively.  Able to use digi flex @1 .5, 3.0 and 5.0 lbs for mass grasp, able to isolate digits 2-5 with 1.5 and 3.0 resistance.    Other Hand Exercises Used yellow putty for proximal thumb xercise - modified grasp exercise with putty to encourage more thumb activation against resistance.  Worked to isolate adduction in 5th digit.      Splinting   Splinting Used  Oval 8 splint to block IP flexion in thumb with attempts to address opposition, flexion, adduction with R thumb.  Patient with strong tendedncy to lead with IP flexion, then difficult to isolate other proximal thumb muscles.                      OT Short Term Goals - 05/04/21 1446       OT SHORT TERM GOAL #1   Title Pt will be independent with HEP for coordination and grip strength    Time 4    Period Weeks    Status On-going    Target Date 05/11/21      OT SHORT TERM GOAL #2   Title Pt will verbalize understanding of adapted strategies and/or equipment for increasing independence and safety with ADLs and IADLs. (cutting vegetables, buttons, fasteners, shoes)    Time 4    Period Weeks    Status On-going      OT SHORT TERM GOAL #3   Title Pt will write 2-3 sentences with 90% legibility with adapted equipment PRN. (possibly built up grip)    Baseline approx 75% of baseline HW  Time 4    Period Weeks    Status On-going      OT SHORT TERM GOAL #4   Title Pt will verbalize understanding of community resources for ALS    Time 4    Period Weeks    Status Achieved               OT Long Term Goals - 05/04/21 1444       OT LONG TERM GOAL #1   Title Pt will verbalize and demonstrate understanding of any splints and/or orthoses wear and care PRN    Time 12    Period Weeks    Status On-going      OT LONG TERM GOAL #2   Title Pt will be independent with HEP for BUE ROM and stretches.    Time 12    Period Weeks    Status New      OT LONG TERM GOAL #3   Title Pt will increase coordination in RUE by completing 9 hole peg test in 45 seconds or less. (with use of splint PRN)    Time 12    Period Weeks    Status New      OT LONG TERM GOAL #4   Title Pt will verbalize understanding of adapted equipment and technology and strategies for work (typing, voice to text, etc)    Time 12    Period Weeks    Status New      OT LONG TERM GOAL #5   Title Pt will  increase grip strength in RUE, dominant hand.    Baseline R 56.6, L 60.8    Time 12    Period Weeks    Status New                   Plan - 05/06/21 1651     Clinical Impression Statement Pt is working to maintain functional abilities as long as possible.  Still working full time - reports fatigue is becoming a challenge.    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 ADL;Strength;Decreased knowledge of use of DME;FMC;Sensation;Coordination;ROM;IADL;UE functional use;Dexterity;Balance;GMC    Rehab Potential Good    Clinical Decision Making Several treatment options, min-mod task modification necessary    Comorbidities Affecting Occupational Performance: May have comorbidities impacting occupational performance    Modification or Assistance to Complete Evaluation  Min-Moderate modification of tasks or assist with assess necessary to complete eval    OT Frequency 2x / week    OT Duration 12 weeks    OT Treatment/Interventions Aquatic Therapy;Self-care/ADL training;Fluidtherapy;DME and/or AE instruction;Splinting;Therapeutic activities;Ultrasound;Therapeutic exercise;Coping strategies training;Functional Mobility Training;Neuromuscular education;Cryotherapy;Paraffin;Energy conservation;Electrical Stimulation;Manual Therapy;Patient/family education    Plan continue to address and adapted equipment coordination RUE- itrinsics, thumb    Recommended Other Services recommended ST evaluation at least even though ST is not covered by insurance.    Consulted and Agree with Plan of Care Patient             Patient will benefit from skilled therapeutic intervention in order to improve the following  deficits and impairments:   Body Structure / Function / Physical Skills: ADL, Strength, Decreased knowledge of use of DME, FMC, Sensation, Coordination, ROM, IADL, UE functional use, Dexterity, Balance, GMC  Visit Diagnosis: Muscle weakness (generalized)  Other lack of coordination  Other symptoms and signs involving the nervous system  Unsteadiness on feet    Problem List Patient Active Problem List   Diagnosis Date Noted   Amyotrophic lateral sclerosis ([facility]) 03/27/2021   Achilles tendon pain 10/21/2020    Collier Salina, OT 05/06/2021, 4:54 PM  [facility] [address] [place], [place], [address] Phone: [phone]   Fax:  [phone]  Name: NARESH ALTHAUS MRN: [id number] Date of Birth: [birth year]

## 2021-05-11 ENCOUNTER — Encounter: Payer: Self-pay | Admitting: Physical Therapy

## 2021-05-11 ENCOUNTER — Ambulatory Visit: Payer: 59 | Admitting: Physical Therapy

## 2021-05-11 ENCOUNTER — Encounter: Payer: Self-pay | Admitting: Occupational Therapy

## 2021-05-11 ENCOUNTER — Ambulatory Visit: Payer: 59 | Admitting: Occupational Therapy

## 2021-05-11 ENCOUNTER — Other Ambulatory Visit: Payer: Self-pay

## 2021-05-11 DIAGNOSIS — M6281 Muscle weakness (generalized): Secondary | ICD-10-CM | POA: Diagnosis not present

## 2021-05-11 DIAGNOSIS — R29818 Other symptoms and signs involving the nervous system: Secondary | ICD-10-CM

## 2021-05-11 DIAGNOSIS — R2681 Unsteadiness on feet: Secondary | ICD-10-CM

## 2021-05-11 DIAGNOSIS — R2689 Other abnormalities of gait and mobility: Secondary | ICD-10-CM

## 2021-05-11 DIAGNOSIS — R278 Other lack of coordination: Secondary | ICD-10-CM

## 2021-05-11 NOTE — Patient Instructions (Signed)
Basic Activities:    Use your affected hand to perform the following activities for 20-30 minutes 1-2 times/day.  Stop activity if you experience pain.   - Flip playing cards - Deal top card with thumb - Toss ball - Rotate ball in your hand  - Open/close cabinet door with handle - Pick up coins and place in a container - Stack coins (stacks of 5) and manipulate one at a time to fingertips to place in bank/container - Fold towels - Stack blocks - Pick up 1-inch blocks

## 2021-05-11 NOTE — Therapy (Signed)
[facility] [address] [place], [place], [address] Phone: [phone]   Fax:  [phone]  Physical Therapy Treatment  Patient Details  Name: Matthew Rosario MRN: [id number] Date of Birth: [birth year] No data recorded  Encounter Date: 05/11/2021   PT End of Session - 05/11/21 1610     Visit Number 5    Number of Visits 13    Date for PT Re-Evaluation 05/29/21    Authorization Type [employer] (25 VL - PT)    PT Start Time 1530    PT Stop Time 1611    PT Time Calculation (min) 41 min    Equipment Utilized During Treatment Gait belt    Activity Tolerance Patient tolerated treatment well    Behavior During Therapy WFL for tasks assessed/performed             Past Medical History:  Diagnosis Date   Amyotrophic lateral sclerosis ([place]) 03/27/2021   Low testosterone     Past Surgical History:  Procedure Laterality Date   HERNIA REPAIR      There were no vitals filed for this visit.   Subjective Assessment - 05/11/21 1531     Subjective Put up Christmas decorations over the weekend. Feeling a little bit sore.    Pertinent History no significant PMH prior to ALS diagnosis    Limitations House hold activities;Standing;Walking    Diagnostic tests Cervical MRI:1. 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  2. Up to mild spinal stenosis at C4-5. Normal cord signal.  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    Patient Stated Goals Pt's goal is to be able to walk more comfortably and being able to maintain the strength that he has.    Currently in Pain? Other (Comment)   "just feeling overall soreness from putting up christmas decorations"                               [facility] Adult PT Treatment/Exercise - 05/11/21 1532       Ambulation/Gait   Ambulation/Gait Yes    Ambulation/Gait Assistance 5: Supervision    Ambulation/Gait Assistance Details With no AD and R AFO between activities    Assistive device None    Gait Pattern Step-through pattern;Decreased step length - right;Decreased stance time - right;Decreased weight shift to right;Poor foot clearance - right;Right steppage    Ambulation Surface Level;Indoor      High Level Balance   High Level Balance Activities Negotiating over obstacles    High Level Balance Comments On level ground: stepping over 2 smaller/2 larger orange obstacles with alternating pattern with intermittent UE support, down and back x4 reps. Then on blue mat, performed with 3 obstacles with step to pattern x3 reps with RLE as stance leg, x3 reps as LLE as stance leg. Min guard for balance, intermittent taps to countertop.      Exercises   Exercises Other Exercises    Other Exercises  Seated Scifit with BLE/BUE at gear 1.7 for 5 minutes for endurance, strength, ROM. After standing balance performed wide BOS rocking at countertop x8 reps for a leg stretch. Pt reporting this stretch feeling good, discussed that pt can try to perform this stretch throughout his  day at home too.                 Balance Exercises - 05/11/21 1600       Balance Exercises: Standing   Standing Eyes Closed Wide (BOA);Foam/compliant surface;4 reps;30 secs;Limitations    Standing Eyes Closed Limitations On air ex with feet hip width 4 x 30 seconds, intermittent taps to wall/chair for balance    SLS with Vectors Solid surface;Limitations    SLS with Vectors Limitations With RLE as stance leg - forward/lateral/backward tap to colorful floor bubble x7 reps, beginning with UE support > none. Pt needing stop stop and bring LLE back to midline before each tap    Rockerboard Lateral    Rockerboard Limitations On  rockerboard- weight shifting x15 reps with cues for technique,  then reaching across body to tap cone in different directions, 8 reps each side, incr difficulty with going towards RLE.                  PT Short Term Goals - 05/06/21 1237       PT SHORT TERM GOAL #1   Title Pt will be able to complete 5x sit <> stand in under 10 seconds w/o UE assist to demonstrate a decreased fall risk.    Baseline 10.53 seconds with UE on legs    Time 3    Period Weeks    Status Not Met    Target Date 05/08/21      PT SHORT TERM GOAL #2   Title Pt will be able to ambulate at >/= 2.62 ft/s w/ LRAD for improved community ambulation and functional mobility.    Baseline 11/14- 13.09 sec= 2.51 ft/sec; 12.06 seconds = 2.72 ft/sec with R AFO and no AD    Time 3    Period Weeks    Status Achieved    Target Date 05/08/21      PT SHORT TERM GOAL #3   Title Pt will be able to ambulate 500' supervision over varied surfaces supervision, w/ LRAD for improved community ambulation and activity tolerance.    Time 3    Period Weeks    Status New    Target Date 05/08/21      PT SHORT TERM GOAL #4   Title Pt will be independent and educated on intial HEP for balance and BLE strength maintanence.    Baseline pt independent with initial strengthening HEP, added balance on 05/06/21    Time 3    Period Weeks    Status Achieved    Target Date 05/08/21               PT Long Term Goals - 04/13/21 1414       PT LONG TERM GOAL #1   Title Pt will be able to independently donn and doff AFO and adhere to wear schedule demonstrating improved orthosis knowledge. (LTG due 05/29/21)    Time 6    Period Weeks    Status New    Target Date 05/29/21      PT LONG TERM GOAL #2   Title Pt will be indpendent w/ final HEP for improved balance and strength maintanence.    Time 6    Period Weeks    Status New    Target Date 05/29/21      PT LONG TERM GOAL #3   Title Pt will be able to ambulate >/= 1000' ft on  varied surfaces supervision for improved community ambulation.  Time 6    Period Weeks    Status New    Target Date 05/29/21      PT LONG TERM GOAL #4   Title Pt will score >/= 50/56 on Berg Balance Scale demonstrating improved balance for decreased fall risk.    Baseline 11/14- 47/56    Time 6    Period Weeks    Status New    Target Date 05/29/21                   Plan - 05/11/21 1621     Clinical Impression Statement Continued to work on standing balance with focus on SLS, obstacle negotiation, and on unlevel surfaces with vision removed and weight shifting. Pt most challenged by obstacles esp with stance time on RLE and on unlevel surfaces. Pt with difficulty with eyes closed on air ex, needing intermittent UE support for balance, indicating decr vestibular input for balance. Will continue to progress towards LTGs.    Personal Factors and Comorbidities Time since onset of injury/illness/exacerbation    Examination-Activity Limitations Squat;Stand;Stairs;Transfers;Lift;Locomotion Level    Examination-Participation Restrictions Art gallery manager;Yard Work;Shop    Stability/Clinical Decision Making Evolving/Moderate complexity    Rehab Potential Good    PT Frequency 2x / week   plus eval   PT Duration 6 weeks    PT Treatment/Interventions ADLs/Self Care Home Management;DME Instruction;Gait training;Stair training;Functional mobility training;Therapeutic activities;Therapeutic exercise;Balance training;Manual techniques;Orthotic Fit/Training;Patient/family education;Neuromuscular re-education;Passive range of motion    PT Next Visit Plan how is HEP? check last STG (forgot to do this today). SciFit for ROM/endurance. functional BLE strength. Standing balance - unlevel surfaces, head motions, SLS. Dynamic Gait with new AFO.    PT Home Exercise Plan [place] and Agree with Plan of Care Patient             Patient will benefit from skilled therapeutic  intervention in order to improve the following deficits and impairments:  Abnormal gait, Decreased activity tolerance, Decreased balance, Decreased range of motion, Decreased mobility, Decreased knowledge of use of DME, Decreased endurance, Decreased strength, Difficulty walking  Visit Diagnosis: Muscle weakness (generalized)  Other symptoms and signs involving the nervous system  Unsteadiness on feet     Problem List Patient Active Problem List   Diagnosis Date Noted   Amyotrophic lateral sclerosis ([place]) 03/27/2021   Achilles tendon pain 10/21/2020    Arliss Journey, PT, DPT  05/11/2021, 4:23 PM  [facility] [address] [place], [place], [address] Phone: [phone]   Fax:  [phone]  Name: Matthew Rosario MRN: [id number] Date of Birth: 1965/11/23

## 2021-05-11 NOTE — Therapy (Signed)
[facility] [address] [place], [place], [address] Phone: [phone]   Fax:  [phone]  Occupational Therapy Treatment  Patient Details  Name: Matthew Rosario MRN: [id number] Date of Birth: 1965/10/06 Referring Provider (OT): Narda Amber, DO   Encounter Date: 05/11/2021   OT End of Session - 05/11/21 1450     Visit Number 7    Number of Visits 25    Date for OT Re-Evaluation 07/06/21    Authorization Type [employer]    Authorization Time Period VL: 25 OT or $2000 max, 25 PT or $2000 max, ST not covered    Authorization - Number of Visits 25    OT Start Time 6962    OT Stop Time 1530    OT Time Calculation (min) 43 min    Activity Tolerance Patient tolerated treatment well    Behavior During Therapy [facility] for tasks assessed/performed             Past Medical History:  Diagnosis Date   Amyotrophic lateral sclerosis ([place]) 03/27/2021   Low testosterone     Past Surgical History:  Procedure Laterality Date   HERNIA REPAIR      There were no vitals filed for this visit.   Subjective Assessment - 05/11/21 1449     Subjective  "I went above and beyond decorating but I took it slow but I am sore today"    Pertinent History PMH: none    Limitations Fall Risk    Patient Stated Goals "better adapt to what may come" "typing"    Currently in Pain? Yes    Pain Location Generalized    Pain Orientation Other (Comment)    Pain Descriptors / Indicators Sore    Pain Type Acute pain    Pain Onset Yesterday    Pain Frequency Rarely            Reviewed goals - STGs for 4 week mark.   Hand Gripper: with RUE on level 2 with black spring. Pt picked up 1 inch blocks with gripper with min drops and min difficulty.  Medium Pegs with RUE with use of short opponens splint on RUE for increasing coordination in RUE. Pt with min difficulty with rotating pegs in RUE. Removed with in hand manipulation in  LUE.                       OT Education - 05/11/21 1518     Education Details coordination HEP    Person(s) Educated Patient    Methods Explanation;Demonstration;Handout    Comprehension Verbalized understanding;Returned demonstration              OT Short Term Goals - 05/11/21 1453       OT SHORT TERM GOAL #1   Title Pt will be independent with HEP for coordination and grip strength    Time 4    Period Weeks    Status On-going   grip strength HEP good, issued coordination 12/12   Target Date 05/11/21      OT SHORT TERM GOAL #2   Title Pt will verbalize understanding of adapted strategies and/or equipment for increasing independence and safety with ADLs and IADLs. (cutting vegetables, buttons, fasteners, shoes)    Time 4    Period Weeks    Status Achieved   have reviewed rocker knife, cutting board, button ghooks, splinting, shoe modifications     OT SHORT TERM GOAL #3   Title  Pt will write 2-3 sentences with 90% legibility with adapted equipment PRN. (possibly built up grip)    Baseline approx 75% of baseline HW    Time 4    Period Weeks    Status Achieved      OT SHORT TERM GOAL #4   Title Pt will verbalize understanding of community resources for ALS    Time 4    Period Weeks    Status Achieved               OT Long Term Goals - 05/04/21 1444       OT LONG TERM GOAL #1   Title Pt will verbalize and demonstrate understanding of any splints and/or orthoses wear and care PRN    Time 12    Period Weeks    Status On-going      OT LONG TERM GOAL #2   Title Pt will be independent with HEP for BUE ROM and stretches.    Time 12    Period Weeks    Status New      OT LONG TERM GOAL #3   Title Pt will increase coordination in RUE by completing 9 hole peg test in 45 seconds or less. (with use of splint PRN)    Time 12    Period Weeks    Status New      OT LONG TERM GOAL #4   Title Pt will verbalize understanding of adapted equipment  and technology and strategies for work (typing, voice to text, etc)    Time 12    Period Weeks    Status New      OT LONG TERM GOAL #5   Title Pt will increase grip strength in RUE, dominant hand.    Baseline R 56.6, L 60.8    Time 12    Period Weeks    Status New                   Plan - 05/11/21 1454     Clinical Impression Statement pt continues to progress and motivated for maintaining functional skills for daily tasks. Pt has met 3/4 STGs.    OT Occupational Profile and History Detailed Assessment- Review of Records and additional review of physical, cognitive, psychosocial history related to current functional performance    Occupational performance deficits (Please refer to evaluation for details): ADL's;IADL's;Leisure;Work    Marketing executive / Function / Physical Skills ADL;Strength;Decreased knowledge of use of DME;FMC;Sensation;Coordination;ROM;IADL;UE functional use;Dexterity;Balance;GMC    Rehab Potential Good    Clinical Decision Making Several treatment options, min-mod task modification necessary    Comorbidities Affecting Occupational Performance: May have comorbidities impacting occupational performance    Modification or Assistance to Complete Evaluation  Min-Moderate modification of tasks or assist with assess necessary to complete eval    OT Frequency 2x / week    OT Duration 12 weeks    OT Treatment/Interventions Aquatic Therapy;Self-care/ADL training;Fluidtherapy;DME and/or AE instruction;Splinting;Therapeutic activities;Ultrasound;Therapeutic exercise;Coping strategies training;Functional Mobility Training;Neuromuscular education;Cryotherapy;Paraffin;Energy conservation;Electrical Stimulation;Manual Therapy;Patient/family education    Plan continue to address and adapted equipment coordination RUE- intrinsics, thumb    Recommended Other Services recommended ST evaluation at least even though ST is not covered by insurance.    Consulted and Agree with Plan  of Care Patient             Patient will benefit from skilled therapeutic intervention in order to improve the following deficits and impairments:   Body Structure / Function / Physical Skills:  ADL, Strength, Decreased knowledge of use of DME, FMC, Sensation, Coordination, ROM, IADL, UE functional use, Dexterity, Balance, GMC       Visit Diagnosis: Muscle weakness (generalized)  Other lack of coordination  Other symptoms and signs involving the nervous system  Unsteadiness on feet  Other abnormalities of gait and mobility    Problem List Patient Active Problem List   Diagnosis Date Noted   Amyotrophic lateral sclerosis ([place]) 03/27/2021   Achilles tendon pain 10/21/2020    Zachery Conch, OT 05/11/2021, 3:30 PM  [facility] [address] [place], [place], [address] Phone: [phone]   Fax:  [phone]  Name: CYNCERE SONTAG MRN: [id number] Date of Birth: 1965/06/10

## 2021-05-13 ENCOUNTER — Other Ambulatory Visit: Payer: Self-pay

## 2021-05-13 ENCOUNTER — Ambulatory Visit: Payer: 59

## 2021-05-13 ENCOUNTER — Ambulatory Visit: Payer: 59 | Admitting: Occupational Therapy

## 2021-05-13 ENCOUNTER — Encounter: Payer: Self-pay | Admitting: Occupational Therapy

## 2021-05-13 DIAGNOSIS — M6281 Muscle weakness (generalized): Secondary | ICD-10-CM

## 2021-05-13 DIAGNOSIS — R278 Other lack of coordination: Secondary | ICD-10-CM

## 2021-05-13 DIAGNOSIS — R262 Difficulty in walking, not elsewhere classified: Secondary | ICD-10-CM

## 2021-05-13 DIAGNOSIS — R2689 Other abnormalities of gait and mobility: Secondary | ICD-10-CM

## 2021-05-13 DIAGNOSIS — R2681 Unsteadiness on feet: Secondary | ICD-10-CM

## 2021-05-13 DIAGNOSIS — R29818 Other symptoms and signs involving the nervous system: Secondary | ICD-10-CM

## 2021-05-13 DIAGNOSIS — M21371 Foot drop, right foot: Secondary | ICD-10-CM

## 2021-05-13 NOTE — Therapy (Signed)
[facility] [address] [place], [place], [address] Phone: [phone]   Fax:  [phone]  Physical Therapy Treatment  Patient Details  Name: Matthew Rosario MRN: [id number] Date of Birth: January 12, 1966 No data recorded  Encounter Date: 05/13/2021   PT End of Session - 05/13/21 1447     Visit Number 6    Number of Visits 13    Date for PT Re-Evaluation 05/29/21    Authorization Type [employer] (25 VL - PT)    PT Start Time 1447    PT Stop Time 1530    PT Time Calculation (min) 43 min    Equipment Utilized During Treatment Gait belt    Activity Tolerance Patient tolerated treatment well    Behavior During Therapy WFL for tasks assessed/performed             Past Medical History:  Diagnosis Date   Amyotrophic lateral sclerosis ([place]) 03/27/2021   Low testosterone     Past Surgical History:  Procedure Laterality Date   HERNIA REPAIR      There were no vitals filed for this visit.   Subjective Assessment - 05/13/21 1450     Subjective Reports still having some soreness from [place]. Feeling a bit sore still. No other new changes.    Pertinent History no significant PMH prior to ALS diagnosis    Limitations House hold activities;Standing;Walking    Diagnostic tests Cervical MRI:1. 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  2. Up to mild spinal stenosis at C4-5. Normal cord signal.  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    Patient Stated Goals Pt's goal is to be able to walk more comfortably and being able to maintain the strength that he has.    Currently in Pain? Other (Comment)   generalized soreness               [facility] Adult PT  Treatment/Exercise - 05/13/21 0001       Ambulation/Gait   Ambulation/Gait Yes    Ambulation/Gait Assistance 5: Supervision    Ambulation/Gait Assistance Details completed ambulation indoors (unable to due outdoors due to weather) across unlevel surfaces x 545 ft, along with negotiating over obstacles to simulate outdoor surfaces, patient able to clear obstacles iwth slowed gait speed and mild unsteadiness on unlevel surfaces noted. supervision throughout    Ambulation Distance (Feet) 545 Feet    Assistive device None    Gait Pattern Step-through pattern;Decreased step length - right;Decreased stance time - right;Decreased weight shift to right;Poor foot clearance - right;Right steppage    Ambulation Surface Level;Indoor      Exercises   Exercises Knee/Hip      Knee/Hip Exercises: Aerobic   Other Aerobic Completed Scifit with BLE/BUE at gear 2.0 for 6 minutes for endurance, strength, ROM. Cues to maintain steps per minute >/= 70                 Balance Exercises - 05/13/21 0001       Balance Exercises: Standing   Stepping Strategy Anterior;Foam/compliant surface;Limitations    Stepping Strategy Limitations standing across red balance beam: completed anterior steps forward and back onto beam x 10 reps bilaterally to promote stepping strategy. Increased challenge noted reuqiring intermittent UE support, especially when stepping  with RLE posterior back onto beam    Balance Beam standing across red balance beam: completed horizontal/vertical head turns x 10 reps each direction. increased challnege w/ vertical > horiz    Other Standing Exercises standing with bil stance on airex completed EC 3 x 30 seconds, then with EO and 3# weighted ball completed trunk rotations x 10 reps to bilat directions.                  PT Short Term Goals - 05/13/21 1655       PT SHORT TERM GOAL #1   Title Pt will be able to complete 5x sit <> stand in under 10 seconds w/o UE assist to  demonstrate a decreased fall risk.    Baseline 10.53 seconds with UE on legs    Time 3    Period Weeks    Status Not Met    Target Date 05/08/21      PT SHORT TERM GOAL #2   Title Pt will be able to ambulate at >/= 2.62 ft/s w/ LRAD for improved community ambulation and functional mobility.    Baseline 11/14- 13.09 sec= 2.51 ft/sec; 12.06 seconds = 2.72 ft/sec with R AFO and no AD    Time 3    Period Weeks    Status Achieved    Target Date 05/08/21      PT SHORT TERM GOAL #3   Title Pt will be able to ambulate 500' supervision over varied surfaces supervision, w/ LRAD for improved community ambulation and activity tolerance.    Baseline supervision 545 ft over indoor complaint surfaces/obstacles without AD    Time 3    Period Weeks    Status Achieved    Target Date 05/08/21      PT SHORT TERM GOAL #4   Title Pt will be independent and educated on intial HEP for balance and BLE strength maintanence.    Baseline pt independent with initial strengthening HEP, added balance on 05/06/21    Time 3    Period Weeks    Status Achieved    Target Date 05/08/21               PT Long Term Goals - 04/13/21 1414       PT LONG TERM GOAL #1   Title Pt will be able to independently donn and doff AFO and adhere to wear schedule demonstrating improved orthosis knowledge. (LTG due 05/29/21)    Time 6    Period Weeks    Status New    Target Date 05/29/21      PT LONG TERM GOAL #2   Title Pt will be indpendent w/ final HEP for improved balance and strength maintanence.    Time 6    Period Weeks    Status New    Target Date 05/29/21      PT LONG TERM GOAL #3   Title Pt will be able to ambulate >/= 1000' ft on varied surfaces supervision for improved community ambulation.    Time 6    Period Weeks    Status New    Target Date 05/29/21      PT LONG TERM GOAL #4   Title Pt will score >/= 50/56 on Berg Balance Scale demonstrating improved balance for decreased fall risk.     Baseline 11/14- 47/56    Time 6    Period Weeks    Status New    Target Date 05/29/21  Plan - 05/13/21 1655     Clinical Impression Statement Finished assesment of patient's progress toward STG. Pt able to meet all STGs at this time, today demonstrating good ability to ambulate > 500 ft on unleve surfaces, continue to have mild unsteadiness but supervision level. rest of session focused on continued balance activities.    Personal Factors and Comorbidities Time since onset of injury/illness/exacerbation    Examination-Activity Limitations Squat;Stand;Stairs;Transfers;Lift;Locomotion Level    Examination-Participation Restrictions Art gallery manager;Yard Work;Shop    Stability/Clinical Decision Making Evolving/Moderate complexity    Rehab Potential Good    PT Frequency 2x / week   plus eval   PT Duration 6 weeks    PT Treatment/Interventions ADLs/Self Care Home Management;DME Instruction;Gait training;Stair training;Functional mobility training;Therapeutic activities;Therapeutic exercise;Balance training;Manual techniques;Orthotic Fit/Training;Patient/family education;Neuromuscular re-education;Passive range of motion    PT Next Visit Plan how is HEP?SciFit for ROM/endurance. functional BLE strength. Standing balance - unlevel surfaces, head motions, SLS. Dynamic Gait with new AFO.    PT Home Exercise Plan [place] and Agree with Plan of Care Patient             Patient will benefit from skilled therapeutic intervention in order to improve the following deficits and impairments:  Abnormal gait, Decreased activity tolerance, Decreased balance, Decreased range of motion, Decreased mobility, Decreased knowledge of use of DME, Decreased endurance, Decreased strength, Difficulty walking  Visit Diagnosis: Muscle weakness (generalized)  Difficulty in walking, not elsewhere classified  Other abnormalities of gait and mobility  Foot drop,  right  Unsteadiness on feet     Problem List Patient Active Problem List   Diagnosis Date Noted   Amyotrophic lateral sclerosis ([place]) 03/27/2021   Achilles tendon pain 10/21/2020    Jones Bales, PT, DPT 05/13/2021, 4:57 PM  [facility] [address] [place], [place], [address] Phone: [phone]   Fax:  [phone]  Name: Matthew Rosario MRN: [id number] Date of Birth: [birth year]

## 2021-05-13 NOTE — Therapy (Signed)
[facility] [address] [place], [place], [address] Phone: [phone]   Fax:  [phone]  Occupational Therapy Treatment  Patient Details  Name: Matthew Rosario MRN: [id number] Date of Birth: December 18, 1965 Referring Provider (OT): Nita Sickle, DO   Encounter Date: 05/13/2021   OT End of Session - 05/13/21 1532     Visit Number 8    Number of Visits 25    Date for OT Re-Evaluation 07/06/21    Authorization Type [employer]    Authorization Time Period VL: 25 OT or $2000 max, 25 PT or $2000 max, ST not covered    Authorization - Number of Visits 25    OT Start Time 1531    OT Stop Time 1615    OT Time Calculation (min) 44 min    Activity Tolerance Patient tolerated treatment well    Behavior During Therapy [facility] for tasks assessed/performed             Past Medical History:  Diagnosis Date   Amyotrophic lateral sclerosis (HCC) 03/27/2021   Low testosterone     Past Surgical History:  Procedure Laterality Date   HERNIA REPAIR      There were no vitals filed for this visit.   Subjective Assessment - 05/13/21 1531     Subjective  "peachy" (about physical therapy)    Pertinent History PMH: none    Limitations Fall Risk    Patient Stated Goals "better adapt to what may come" "typing"    Currently in Pain? No/denies   generalized soreness   Pain Location Generalized    Pain Descriptors / Indicators Sore    Pain Type Acute pain    Pain Onset Yesterday    Pain Frequency Rarely             Hand Gripper: with RUE on level 3 with black spring. Pt picked up 1 inch blocks with gripper with mod drops and mod difficulty.  Small Pegboard  with RUE with min/mod difficulty with coordination and copied pattern with no difficulty. Removed with RUE.  Power Landscape architect x 10 reps with each hand.  Tendon Glides  for instrinsic strengthening and use of PIP and DIP block splint to work MP  flexion.                     OT Short Term Goals - 05/11/21 1453       OT SHORT TERM GOAL #1   Title Pt will be independent with HEP for coordination and grip strength    Time 4    Period Weeks    Status On-going   grip strength HEP good, issued coordination 12/12   Target Date 05/11/21      OT SHORT TERM GOAL #2   Title Pt will verbalize understanding of adapted strategies and/or equipment for increasing independence and safety with ADLs and IADLs. (cutting vegetables, buttons, fasteners, shoes)    Time 4    Period Weeks    Status Achieved   have reviewed rocker knife, cutting board, button ghooks, splinting, shoe modifications     OT SHORT TERM GOAL #3   Title Pt will write 2-3 sentences with 90% legibility with adapted equipment PRN. (possibly built up grip)    Baseline approx 75% of baseline HW    Time 4    Period Weeks    Status Achieved      OT SHORT TERM GOAL #4   Title Pt will verbalize  understanding of community resources for ALS    Time 4    Period Weeks    Status Achieved               OT Long Term Goals - 05/04/21 1444       OT LONG TERM GOAL #1   Title Pt will verbalize and demonstrate understanding of any splints and/or orthoses wear and care PRN    Time 12    Period Weeks    Status On-going      OT LONG TERM GOAL #2   Title Pt will be independent with HEP for BUE ROM and stretches.    Time 12    Period Weeks    Status New      OT LONG TERM GOAL #3   Title Pt will increase coordination in RUE by completing 9 hole peg test in 45 seconds or less. (with use of splint PRN)    Time 12    Period Weeks    Status New      OT LONG TERM GOAL #4   Title Pt will verbalize understanding of adapted equipment and technology and strategies for work (typing, voice to text, etc)    Time 12    Period Weeks    Status New      OT LONG TERM GOAL #5   Title Pt will increase grip strength in RUE, dominant hand.    Baseline R 56.6, L 60.8     Time 12    Period Weeks    Status New                   Plan - 05/13/21 1552     Clinical Impression Statement Pt with improved with coordination with splint.    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 ADL;Strength;Decreased knowledge of use of DME;FMC;Sensation;Coordination;ROM;IADL;UE functional use;Dexterity;Balance;GMC    Rehab Potential Good    Clinical Decision Making Several treatment options, min-mod task modification necessary    Comorbidities Affecting Occupational Performance: May have comorbidities impacting occupational performance    Modification or Assistance to Complete Evaluation  Min-Moderate modification of tasks or assist with assess necessary to complete eval    OT Frequency 2x / week    OT Duration 12 weeks    OT Treatment/Interventions Aquatic Therapy;Self-care/ADL training;Fluidtherapy;DME and/or AE instruction;Splinting;Therapeutic activities;Ultrasound;Therapeutic exercise;Coping strategies training;Functional Mobility Training;Neuromuscular education;Cryotherapy;Paraffin;Energy conservation;Electrical Stimulation;Manual Therapy;Patient/family education    Plan continue to address and adapted equipment coordination RUE- intrinsics, thumb    Recommended Other Services recommended ST evaluation at least even though ST is not covered by insurance.    Consulted and Agree with Plan of Care Patient             Patient will benefit from skilled therapeutic intervention in order to improve the following deficits and impairments:   Body Structure / Function / Physical Skills: ADL, Strength, Decreased knowledge of use of DME, FMC, Sensation, Coordination, ROM, IADL, UE  functional use, Dexterity, Balance, GMC       Visit Diagnosis: Muscle weakness (generalized)  Difficulty in walking, not elsewhere classified  Other abnormalities of gait and mobility  Unsteadiness on feet  Other lack of coordination  Other symptoms and signs involving the nervous system    Problem List  Patient Active Problem List   Diagnosis Date Noted   Amyotrophic lateral sclerosis (HCC) 03/27/2021   Achilles tendon pain 10/21/2020    Junious Dresser, OT 05/14/2021, 8:20 AM  [facility] [address] [place], [place], [address] Phone: [phone]   Fax:  [phone]  Name: SKYE RODARTE MRN: [id number] Date of Birth: [birth year]

## 2021-05-18 ENCOUNTER — Other Ambulatory Visit: Payer: Self-pay

## 2021-05-18 ENCOUNTER — Encounter: Payer: Self-pay | Admitting: Occupational Therapy

## 2021-05-18 ENCOUNTER — Ambulatory Visit: Payer: 59

## 2021-05-18 ENCOUNTER — Ambulatory Visit: Payer: 59 | Admitting: Occupational Therapy

## 2021-05-18 DIAGNOSIS — R278 Other lack of coordination: Secondary | ICD-10-CM

## 2021-05-18 DIAGNOSIS — R2689 Other abnormalities of gait and mobility: Secondary | ICD-10-CM

## 2021-05-18 DIAGNOSIS — M6281 Muscle weakness (generalized): Secondary | ICD-10-CM | POA: Diagnosis not present

## 2021-05-18 DIAGNOSIS — R2681 Unsteadiness on feet: Secondary | ICD-10-CM

## 2021-05-18 DIAGNOSIS — R262 Difficulty in walking, not elsewhere classified: Secondary | ICD-10-CM

## 2021-05-18 DIAGNOSIS — R29818 Other symptoms and signs involving the nervous system: Secondary | ICD-10-CM

## 2021-05-18 DIAGNOSIS — M21371 Foot drop, right foot: Secondary | ICD-10-CM

## 2021-05-18 NOTE — Therapy (Signed)
[facility] [address] [place], [place], [address] Phone: [phone]   Fax:  [phone]  Occupational Therapy Treatment  Patient Details  Name: BERLIE HATCHEL MRN: [id number] Date of Birth: 31-Dec-1965 Referring Provider (OT): Nita Sickle, DO   Encounter Date: 05/18/2021   OT End of Session - 05/18/21 1539     Visit Number 9    Number of Visits 25    Date for OT Re-Evaluation 07/06/21    Authorization Type [employer]    Authorization Time Period VL: 25 OT or $2000 max, 25 PT or $2000 max, ST not covered    Authorization - Number of Visits 25    OT Start Time 1537    OT Stop Time 1615    OT Time Calculation (min) 38 min    Activity Tolerance Patient tolerated treatment well    Behavior During Therapy [facility] for tasks assessed/performed             Past Medical History:  Diagnosis Date   Amyotrophic lateral sclerosis (HCC) 03/27/2021   Low testosterone     Past Surgical History:  Procedure Laterality Date   HERNIA REPAIR      There were no vitals filed for this visit.   Subjective Assessment - 05/18/21 1538     Subjective  "I'm sore after that (PT) - my hands were sore today"    Pertinent History PMH: none    Limitations Fall Risk    Patient Stated Goals "better adapt to what may come" "typing"    Currently in Pain? Yes    Pain Score 3     Pain Location Hand    Pain Orientation Right;Left    Pain Descriptors / Indicators Sore    Pain Type Acute pain    Pain Onset Yesterday    Pain Frequency Occasional              Grooved Pegs  RUE with short opponens splint on with mod drops and difficulty. Pt held a dice in hand to place pegs. Pt removed with in hand manipulation with mod drops.                      OT Education - 05/18/21 1544     Education Details Yellow Theraband Access Code: [email or web address]    Person(s) Educated Patient    Methods Explanation;Demonstration;Handout     Comprehension Verbalized understanding;Returned demonstration              OT Short Term Goals - 05/11/21 1453       OT SHORT TERM GOAL #1   Title Pt will be independent with HEP for coordination and grip strength    Time 4    Period Weeks    Status On-going   grip strength HEP good, issued coordination 12/12   Target Date 05/11/21      OT SHORT TERM GOAL #2   Title Pt will verbalize understanding of adapted strategies and/or equipment for increasing independence and safety with ADLs and IADLs. (cutting vegetables, buttons, fasteners, shoes)    Time 4    Period Weeks    Status Achieved   have reviewed rocker knife, cutting board, button ghooks, splinting, shoe modifications     OT SHORT TERM GOAL #3   Title Pt will write 2-3 sentences with 90% legibility with adapted equipment PRN. (possibly built up grip)    Baseline approx 75% of baseline HW    Time 4  Period Weeks    Status Achieved      OT SHORT TERM GOAL #4   Title Pt will verbalize understanding of community resources for ALS    Time 4    Period Weeks    Status Achieved               OT Long Term Goals - 05/04/21 1444       OT LONG TERM GOAL #1   Title Pt will verbalize and demonstrate understanding of any splints and/or orthoses wear and care PRN    Time 12    Period Weeks    Status On-going      OT LONG TERM GOAL #2   Title Pt will be independent with HEP for BUE ROM and stretches.    Time 12    Period Weeks    Status New      OT LONG TERM GOAL #3   Title Pt will increase coordination in RUE by completing 9 hole peg test in 45 seconds or less. (with use of splint PRN)    Time 12    Period Weeks    Status New      OT LONG TERM GOAL #4   Title Pt will verbalize understanding of adapted equipment and technology and strategies for work (typing, voice to text, etc)    Time 12    Period Weeks    Status New      OT LONG TERM GOAL #5   Title Pt will increase grip strength in RUE, dominant  hand.    Baseline R 56.6, L 60.8    Time 12    Period Weeks    Status New                   Plan - 05/18/21 1615     Clinical Impression Statement Pt demonstrated improved in hand manipulation today with RUE.    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 ADL;Strength;Decreased knowledge of use of DME;FMC;Sensation;Coordination;ROM;IADL;UE functional use;Dexterity;Balance;GMC    Rehab Potential Good    Clinical Decision Making Several treatment options, min-mod task modification necessary    Comorbidities Affecting Occupational Performance: May have comorbidities impacting occupational performance    Modification or Assistance to Complete Evaluation  Min-Moderate modification of tasks or assist with assess necessary to complete eval    OT Frequency 2x / week    OT Duration 12 weeks    OT Treatment/Interventions Aquatic Therapy;Self-care/ADL training;Fluidtherapy;DME and/or AE instruction;Splinting;Therapeutic activities;Ultrasound;Therapeutic exercise;Coping strategies training;Functional Mobility Training;Neuromuscular education;Cryotherapy;Paraffin;Energy conservation;Electrical Stimulation;Manual Therapy;Patient/family education    Plan see how appt went with [facility], review yellow theraband PRN    Recommended Other Services recommended ST evaluation at least even though ST is not covered by insurance.    Consulted and Agree with Plan of Care Patient             Patient will benefit from skilled therapeutic intervention in order to improve the following deficits and impairments:   Body Structure / Function / Physical Skills: ADL, Strength, Decreased knowledge of use of  DME, FMC, Sensation, Coordination, ROM, IADL, UE functional use, Dexterity, Balance, GMC       Visit Diagnosis: Muscle weakness (generalized)  Other lack of coordination  Other symptoms and  signs involving the nervous system  Other abnormalities of gait and mobility    Problem List Patient Active Problem List   Diagnosis Date Noted   Amyotrophic lateral sclerosis (HCC) 03/27/2021   Achilles tendon pain 10/21/2020    Junious Dresser, OT 05/18/2021, 4:16 PM  [facility] [address] [place], [place], [address] Phone: [phone]   Fax:  [phone]  Name: ALYUS MOFIELD MRN: [id number] Date of Birth: 08-02-1965

## 2021-05-18 NOTE — Therapy (Signed)
[facility] [address] [place], [place], [address] Phone: [phone]   Fax:  [phone]  Physical Therapy Treatment  Patient Details  Name: Matthew Rosario MRN: [id number] Date of Birth: 09/03/1965 No data recorded  Encounter Date: 05/18/2021   PT End of Session - 05/18/21 1446     Visit Number 7    Number of Visits 13    Date for PT Re-Evaluation 05/29/21    Authorization Type [employer] (25 VL - PT)    PT Start Time 1446    PT Stop Time 1530    PT Time Calculation (min) 44 min    Equipment Utilized During Treatment Gait belt    Activity Tolerance Patient tolerated treatment well    Behavior During Therapy WFL for tasks assessed/performed             Past Medical History:  Diagnosis Date   Amyotrophic lateral sclerosis (HCC) 03/27/2021   Low testosterone     Past Surgical History:  Procedure Laterality Date   HERNIA REPAIR      There were no vitals filed for this visit.   Subjective Assessment - 05/18/21 1449     Subjective Patient reports that the tips of the toe have rubbed blister, unsure of why. No other new changes. Blew leaves over the weekend.    Pertinent History no significant PMH prior to ALS diagnosis    Limitations House hold activities;Standing;Walking    Diagnostic tests Cervical MRI:1. 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  2. Up to mild spinal stenosis at C4-5. Normal cord signal.  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    Patient Stated Goals Pt's goal is to be able to walk more comfortably and being able to maintain the strength that he has.    Currently in Pain? No/denies                                [facility] Adult PT Treatment/Exercise - 05/18/21 0001       Transfers   Transfers Sit to Stand;Stand to Sit    Sit to Stand 5: Supervision    Stand to Sit 5: Supervision    Number of Reps 10 reps;2 sets    Comments sit <> stands from mat with hands on knee, completed staggered stance x 2 sets (alternating foot position between sets)      Ambulation/Gait   Ambulation/Gait Yes    Ambulation/Gait Assistance 6: Modified independent (Device/Increase time)    Ambulation/Gait Assistance Details throughout therapy gym w/ activities    Assistive device None    Gait Pattern Step-through pattern;Decreased step length - right;Decreased stance time - right;Decreased weight shift to right;Poor foot clearance - right;Right steppage    Ambulation Surface Level;Indoor      Exercises   Exercises Other Exercises    Other Exercises  at wall: completed wall squat 2 x 10 reps without UE support working on pushing into upright position with BLE. short rest break between sets                 Balance Exercises - 05/18/21 0001       Balance Exercises: Standing   Stepping Strategy Anterior;Foam/compliant surface;Posterior  Stepping Strategy Limitations standing on airex: completed anterior/posterior stepping strategy x 15 reps bil. intermittent cues for step height to avoid catching toe on airex. Pt require CGA and intemrittent touch A.    Sidestepping Foam/compliant support;Limitations;3 reps    Sidestepping Limitations completed lateral side stepping across blue balance beam, slow and controlled mvoements x  3 laps down and back. CGA    Step Over Hurdles / Cones completed forward ambulation with bilateral toe tap to cone followed by stepover with trailing LE, completed x 4 cones, x 2 laps down and back. CGA intermittent cues for weight shift/standing tall through stance LE.    Other Standing Exercises Comments intermittent rest breaks required                   PT Short Term Goals - 05/13/21 1655       PT SHORT TERM GOAL #1   Title Pt will be able to complete 5x sit <> stand in under 10 seconds w/o UE assist to demonstrate a decreased fall risk.    Baseline 10.53 seconds with UE on legs    Time 3    Period Weeks    Status Not Met    Target Date 05/08/21      PT SHORT TERM GOAL #2   Title Pt will be able to ambulate at >/= 2.62 ft/s w/ LRAD for improved community ambulation and functional mobility.    Baseline 11/14- 13.09 sec= 2.51 ft/sec; 12.06 seconds = 2.72 ft/sec with R AFO and no AD    Time 3    Period Weeks    Status Achieved    Target Date 05/08/21      PT SHORT TERM GOAL #3   Title Pt will be able to ambulate 500' supervision over varied surfaces supervision, w/ LRAD for improved community ambulation and activity tolerance.    Baseline supervision 545 ft over indoor complaint surfaces/obstacles without AD    Time 3    Period Weeks    Status Achieved    Target Date 05/08/21      PT SHORT TERM GOAL #4   Title Pt will be independent and educated on intial HEP for balance and BLE strength maintanence.    Baseline pt independent with initial strengthening HEP, added balance on 05/06/21    Time 3    Period Weeks    Status Achieved    Target Date 05/08/21               PT Long Term Goals - 04/13/21 1414       PT LONG TERM GOAL #1   Title Pt will be able to independently donn and doff AFO and adhere to wear schedule demonstrating improved orthosis knowledge. (LTG due 05/29/21)    Time 6    Period Weeks    Status New    Target Date 05/29/21      PT LONG TERM GOAL #2   Title Pt will be indpendent w/ final HEP for improved balance and strength maintanence.    Time 6    Period Weeks    Status New    Target Date 05/29/21      PT LONG TERM GOAL #3   Title Pt will be able to ambulate >/= 1000' ft on varied surfaces supervision for improved community ambulation.    Time 6    Period Weeks    Status New     Target Date 05/29/21      PT LONG  TERM GOAL #4   Title Pt will score >/= 50/56 on Berg Balance Scale demonstrating improved balance for decreased fall risk.    Baseline 11/14- 47/56    Time 6    Period Weeks    Status New    Target Date 05/29/21                   Plan - 05/18/21 1714     Clinical Impression Statement Today's skilled PT session focused on continued BLE strengthening and balance activites. Continue to demo increased challenge with SLS activities requiring intermittent UE support to maintain balance. Will continue per POC.    Personal Factors and Comorbidities Time since onset of injury/illness/exacerbation    Examination-Activity Limitations Squat;Stand;Stairs;Transfers;Lift;Locomotion Level    Examination-Participation Restrictions Art gallery manager;Yard Work;Shop    Stability/Clinical Decision Making Evolving/Moderate complexity    Rehab Potential Good    PT Frequency 2x / week   plus eval   PT Duration 6 weeks    PT Treatment/Interventions ADLs/Self Care Home Management;DME Instruction;Gait training;Stair training;Functional mobility training;Therapeutic activities;Therapeutic exercise;Balance training;Manual techniques;Orthotic Fit/Training;Patient/family education;Neuromuscular re-education;Passive range of motion    PT Next Visit Plan SciFit for ROM/endurance. functional BLE strength. Standing balance - unlevel surfaces, head motions, SLS. Dynamic Gait with new AFO.    PT Home Exercise Plan Waves and Agree with Plan of Care Patient             Patient will benefit from skilled therapeutic intervention in order to improve the following deficits and impairments:  Abnormal gait, Decreased activity tolerance, Decreased balance, Decreased range of motion, Decreased mobility, Decreased knowledge of use of DME, Decreased endurance, Decreased strength, Difficulty walking  Visit Diagnosis: Muscle weakness (generalized)  Difficulty  in walking, not elsewhere classified  Other abnormalities of gait and mobility  Foot drop, right  Unsteadiness on feet     Problem List Patient Active Problem List   Diagnosis Date Noted   Amyotrophic lateral sclerosis ([place]) 03/27/2021   Achilles tendon pain 10/21/2020    Jones Bales, PT, DPT 05/18/2021, 5:16 PM  [place] [address] [place], [place], [address] Phone: [phone]   Fax:  [phone]  Name: Matthew Rosario MRN: [id number] Date of Birth: Nov 27, 1965

## 2021-05-18 NOTE — Patient Instructions (Signed)
Access Code: [email or web address] URL: [email or web address] Date: 05/18/2021 Prepared by: Kallie Edward  Exercises Standing Elbow Extension with Self-Anchored Resistance - 1 x daily - 7 x weekly - 3 sets - 10 reps Standing Elbow Flexion with Self-Anchored Resistance - 1 x daily - 7 x weekly - 3 sets - 10 reps Standing Shoulder Horizontal Abduction with Resistance - 1 x daily - 7 x weekly - 3 sets - 10 reps Standing Shoulder Flexion with Resistance - 1 x daily - 7 x weekly - 3 sets - 10 reps Standing Shoulder Diagonal Horizontal Abduction 60/120 Degrees with Resistance - 1 x daily - 7 x weekly - 3 sets - 10 reps Standing Single Arm Shoulder Abduction with Resistance - 1 x daily - 7 x weekly - 3 sets - 10 reps Single Arm Punch with Resistance - 1 x daily - 7 x weekly - 3 sets - 10 reps

## 2021-05-20 ENCOUNTER — Ambulatory Visit: Payer: 59

## 2021-05-20 ENCOUNTER — Encounter: Payer: 59 | Admitting: Occupational Therapy

## 2021-05-22 ENCOUNTER — Telehealth: Payer: Self-pay

## 2021-05-22 DIAGNOSIS — G1221 Amyotrophic lateral sclerosis: Secondary | ICD-10-CM

## 2021-05-22 NOTE — Telephone Encounter (Signed)
Standing Orders Created for CMP. When results come in we will need to fax to [facility]  [phone].

## 2021-05-27 ENCOUNTER — Ambulatory Visit: Payer: 59 | Admitting: Occupational Therapy

## 2021-05-27 ENCOUNTER — Ambulatory Visit: Payer: 59

## 2021-05-29 ENCOUNTER — Ambulatory Visit: Payer: 59

## 2021-05-29 ENCOUNTER — Ambulatory Visit: Payer: 59 | Admitting: Occupational Therapy

## 2021-06-29 ENCOUNTER — Ambulatory Visit: Payer: 59 | Admitting: Neurology

## 2021-10-07 DIAGNOSIS — R253 Fasciculation: Secondary | ICD-10-CM | POA: Diagnosis not present

## 2021-10-07 DIAGNOSIS — G1221 Amyotrophic lateral sclerosis: Secondary | ICD-10-CM | POA: Diagnosis not present

## 2021-10-07 DIAGNOSIS — Z79899 Other long term (current) drug therapy: Secondary | ICD-10-CM | POA: Diagnosis not present

## 2021-10-07 DIAGNOSIS — R29898 Other symptoms and signs involving the musculoskeletal system: Secondary | ICD-10-CM | POA: Diagnosis not present

## 2021-10-07 DIAGNOSIS — R471 Dysarthria and anarthria: Secondary | ICD-10-CM | POA: Diagnosis not present

## 2021-10-07 DIAGNOSIS — R292 Abnormal reflex: Secondary | ICD-10-CM | POA: Diagnosis not present

## 2021-10-07 DIAGNOSIS — Z7409 Other reduced mobility: Secondary | ICD-10-CM | POA: Diagnosis not present

## 2021-10-07 DIAGNOSIS — M6250 Muscle wasting and atrophy, not elsewhere classified, unspecified site: Secondary | ICD-10-CM | POA: Diagnosis not present

## 2021-10-07 DIAGNOSIS — R278 Other lack of coordination: Secondary | ICD-10-CM | POA: Diagnosis not present

## 2021-10-07 DIAGNOSIS — R531 Weakness: Secondary | ICD-10-CM | POA: Diagnosis not present

## 2021-10-30 DIAGNOSIS — G1221 Amyotrophic lateral sclerosis: Secondary | ICD-10-CM | POA: Diagnosis not present

## 2021-11-13 DIAGNOSIS — G1221 Amyotrophic lateral sclerosis: Secondary | ICD-10-CM | POA: Diagnosis not present

## 2021-11-29 DIAGNOSIS — G1221 Amyotrophic lateral sclerosis: Secondary | ICD-10-CM | POA: Diagnosis not present

## 2021-12-13 DIAGNOSIS — G1221 Amyotrophic lateral sclerosis: Secondary | ICD-10-CM | POA: Diagnosis not present

## 2021-12-30 DIAGNOSIS — G1221 Amyotrophic lateral sclerosis: Secondary | ICD-10-CM | POA: Diagnosis not present

## 2021-12-30 DIAGNOSIS — Z7409 Other reduced mobility: Secondary | ICD-10-CM | POA: Diagnosis not present

## 2022-01-13 DIAGNOSIS — G1221 Amyotrophic lateral sclerosis: Secondary | ICD-10-CM | POA: Diagnosis not present

## 2022-01-30 DIAGNOSIS — G1221 Amyotrophic lateral sclerosis: Secondary | ICD-10-CM | POA: Diagnosis not present

## 2022-02-13 DIAGNOSIS — G1221 Amyotrophic lateral sclerosis: Secondary | ICD-10-CM | POA: Diagnosis not present

## 2022-03-01 DIAGNOSIS — G1221 Amyotrophic lateral sclerosis: Secondary | ICD-10-CM | POA: Diagnosis not present

## 2022-03-15 DIAGNOSIS — G1221 Amyotrophic lateral sclerosis: Secondary | ICD-10-CM | POA: Diagnosis not present

## 2022-04-01 DIAGNOSIS — G1221 Amyotrophic lateral sclerosis: Secondary | ICD-10-CM | POA: Diagnosis not present

## 2022-04-07 DIAGNOSIS — G1221 Amyotrophic lateral sclerosis: Secondary | ICD-10-CM | POA: Diagnosis not present

## 2022-04-07 DIAGNOSIS — Z79899 Other long term (current) drug therapy: Secondary | ICD-10-CM | POA: Diagnosis not present

## 2022-04-15 DIAGNOSIS — G1221 Amyotrophic lateral sclerosis: Secondary | ICD-10-CM | POA: Diagnosis not present

## 2022-05-01 DIAGNOSIS — G1221 Amyotrophic lateral sclerosis: Secondary | ICD-10-CM | POA: Diagnosis not present

## 2022-05-15 DIAGNOSIS — G1221 Amyotrophic lateral sclerosis: Secondary | ICD-10-CM | POA: Diagnosis not present

## 2022-06-01 DIAGNOSIS — G1221 Amyotrophic lateral sclerosis: Secondary | ICD-10-CM | POA: Diagnosis not present

## 2022-06-06 IMAGING — MR MR HEAD W/O CM
11 series · 48 of 48 positions shown · non-contrast
Comparison: None.

CLINICAL DATA: Right foot drop for 4 months.  Hyper reflexia

EXAM:
MRI HEAD WITHOUT CONTRAST
MRI CERVICAL SPINE WITHOUT CONTRAST
TECHNIQUE: Multiplanar, multiecho pulse sequences of the brain and surrounding
structures, and cervical spine, to include the craniocervical
junction and cervicothoracic junction, were obtained without
intravenous contrast.

[Series 2: T1 · sagittal · 5.0mm · 0.45mm/px · 2 of 25 slices shown]
[im 1/25]
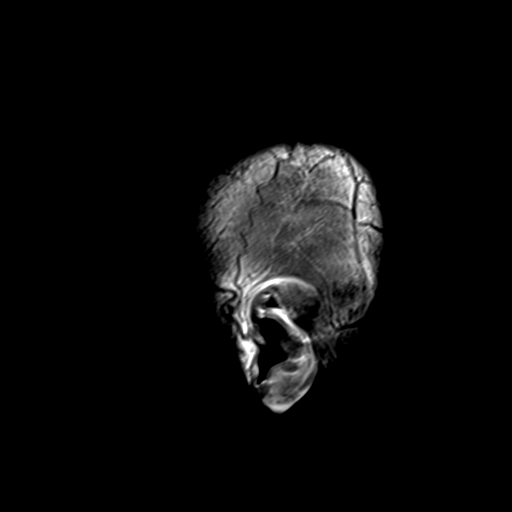
[im 25/25]
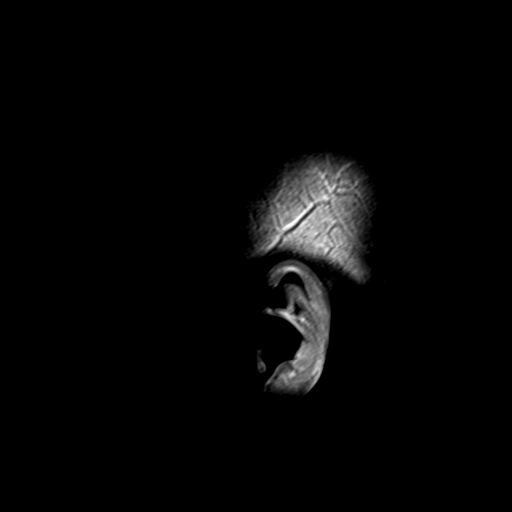

[Series 3: ax ep2d_diff_3 · axial · 3.0mm · 1.80mm/px · z∈[-70,+92]mm · 8 of 108 slices shown]
[im 1/108]
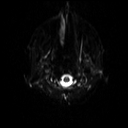
[im 16/108]
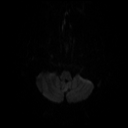
[im 31/108]
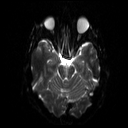
[im 46/108]
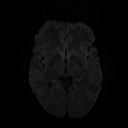
[im 62/108]
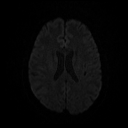
[im 77/108]
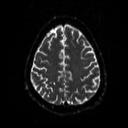
[im 92/108]
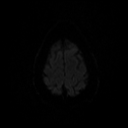
[im 108/108]
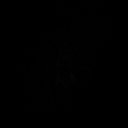

[Series 4: ax ep2d_diff_3_adc · axial · 3.0mm · 1.80mm/px · z∈[-70,+92]mm · 4 of 54 slices shown]
[im 1/54]
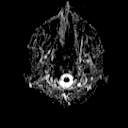
[im 18/54]
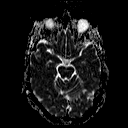
[im 36/54]
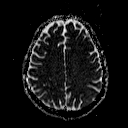
[im 54/54]
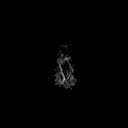

[Series 5: cor ep2d_diff · coronal · 5.0mm · 1.77mm/px · 4 of 60 slices shown]
[im 1/60]
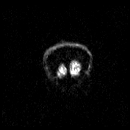
[im 20/60]
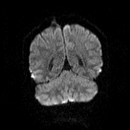
[im 40/60]
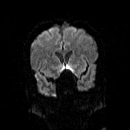
[im 60/60]
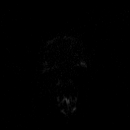

[Series 6: cor ep2d_diff_adc · coronal · 5.0mm · 1.77mm/px · 2 of 30 slices shown]
[im 1/30]
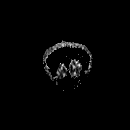
[im 30/30]
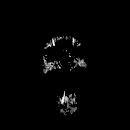

[Series 8: swi_images · axial · 2.0mm · 0.98mm/px · z∈[-67,+91]mm · 6 of 80 slices shown]
[im 1/80]
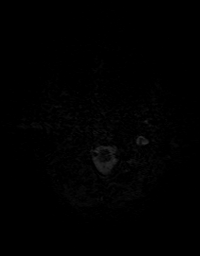
[im 16/80]
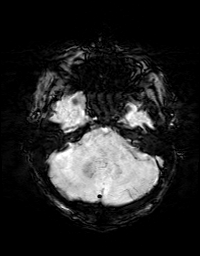
[im 32/80]
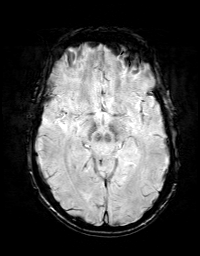
[im 48/80]
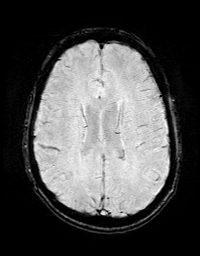
[im 64/80]
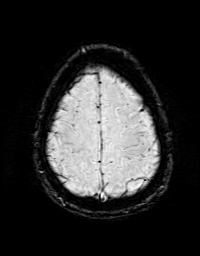
[im 80/80]
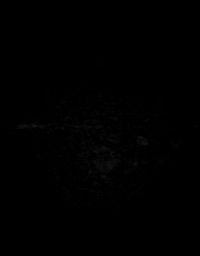

[Series 9: FLAIR · axial · 3.0mm · 0.43mm/px · z∈[-64,+87]mm · 3 of 40 slices shown (1 of 2)]
[im 1/40]
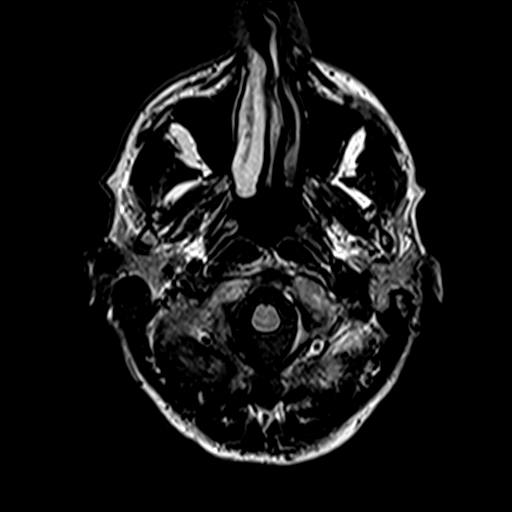
[im 20/40]
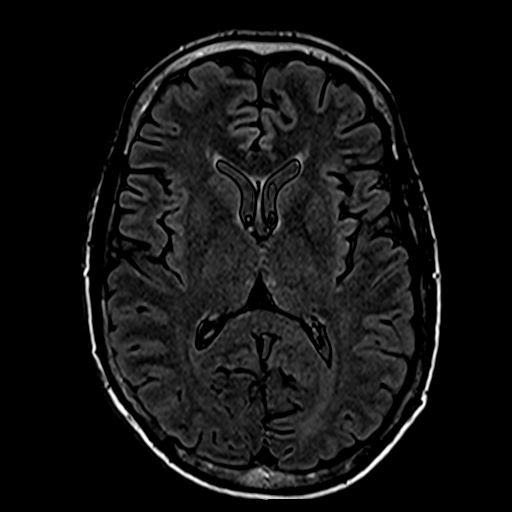
[im 40/40]
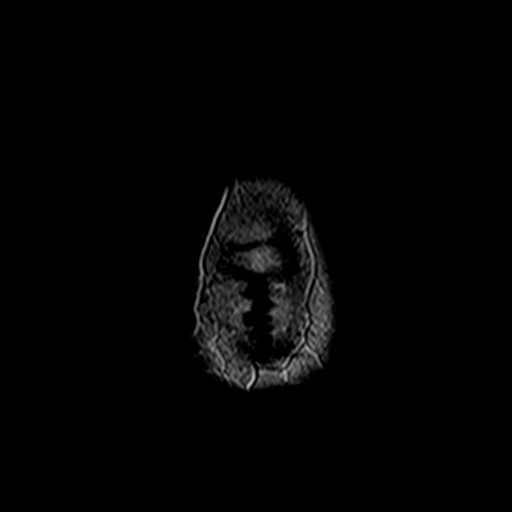

[Series 10: T2 · axial · 5.0mm · 0.65mm/px · z∈[-72,+96]mm · 2 of 29 slices shown (1 of 2)]
[im 1/29]
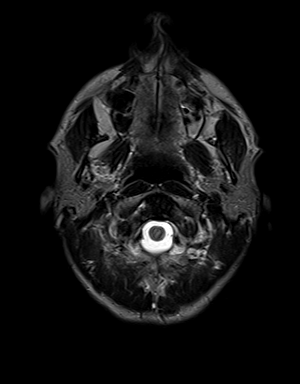
[im 29/29]
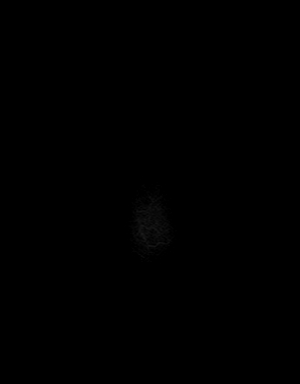

[Series 11: t1_mpr_tra · axial · 1.0mm · 0.72mm/px · z∈[-67,+92]mm · 12 of 160 slices shown]
[im 1/160]
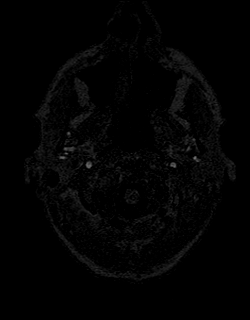
[im 15/160]
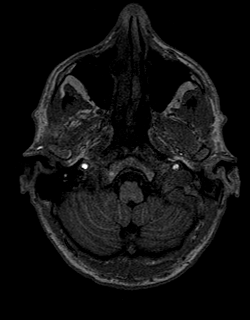
[im 29/160]
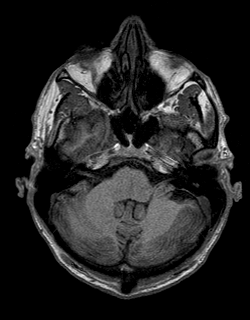
[im 44/160]
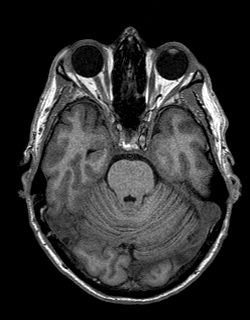
[im 58/160]
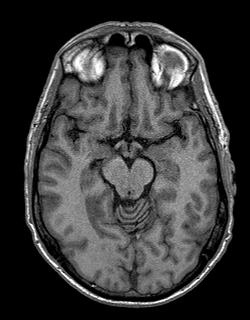
[im 73/160]
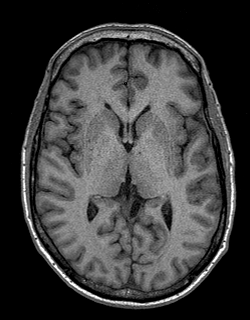
[im 87/160]
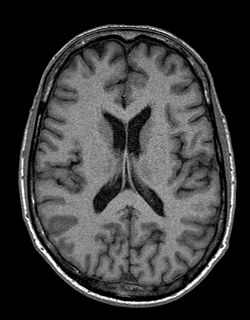
[im 102/160]
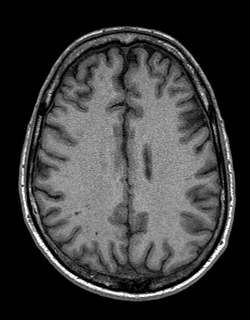
[im 116/160]
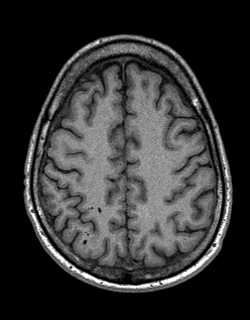
[im 131/160]
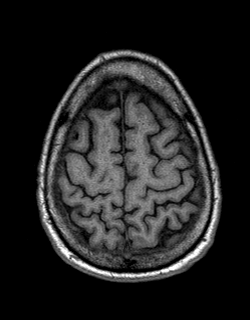
[im 145/160]
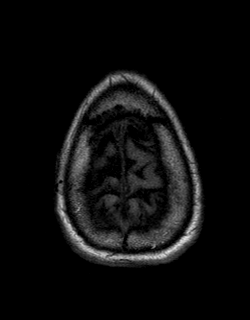
[im 160/160]
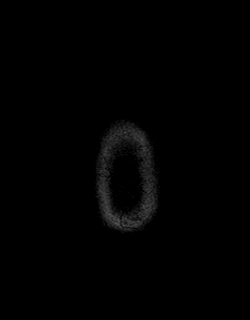

[Series 12: FLAIR · sagittal · 3.0mm · 0.43mm/px · 3 of 40 slices shown (2 of 2)]
[im 1/40]
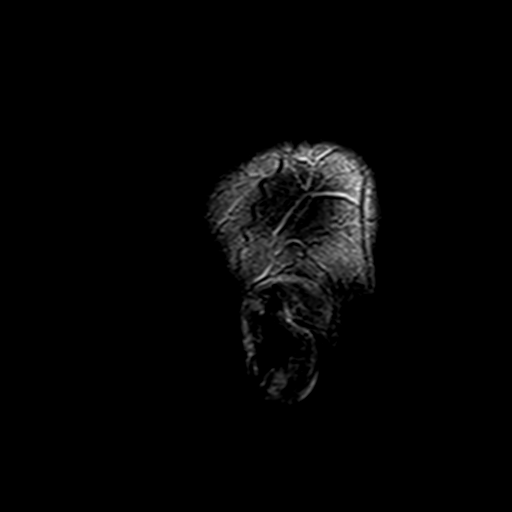
[im 20/40]
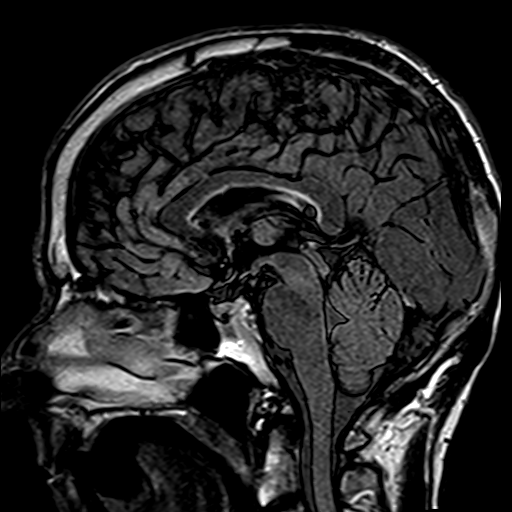
[im 40/40]
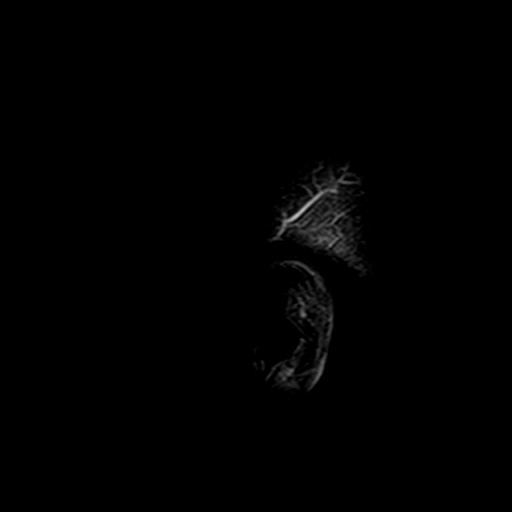

[Series 13: T2 · coronal · 5.0mm · 0.43mm/px · 2 of 28 slices shown (2 of 2)]
[im 1/28]
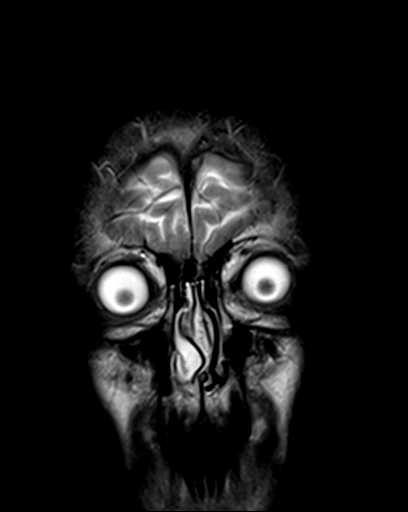
[im 28/28]
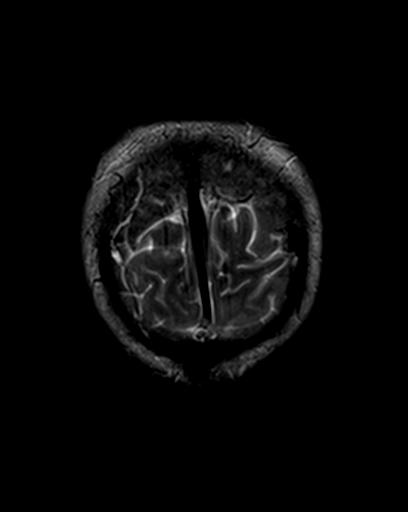

[48 of 48 positions shown; findings below may reference images not displayed]

FINDINGS: MRI HEAD FINDINGS

Brain: 2 or 3 small remote white matter insults without specific
pattern. No infarct, hemorrhage, hydrocephalus, or collection. No
masslike finding or atrophy. Small cluster of dilated perivascular
spaces in the right parietal region.

Vascular: Normal flow voids.

Skull and upper cervical spine: Normal marrow signal.

Sinuses/Orbits: Negative.

Other: Partial left mastoid opacification with negative nasopharynx.

MRI CERVICAL SPINE FINDINGS

Alignment: Reversal of cervical lordosis.

Vertebrae: No fracture, evidence of discitis, or bone lesion.

Cord: Normal signal and morphology.

Posterior Fossa, vertebral arteries, paraspinal tissues: No
perispinal mass or inflammation

Disc levels:

C2-3: Unremarkable.

C3-4: Disc narrowing and bulging with uncovertebral ridging and
moderate left foraminal stenosis.

C4-5: Disc narrowing and bulging with broad central protrusion. Disc
material and uncovertebral spurring causes biforaminal impingement.
Mild spinal stenosis

C5-6: Disc narrowing and bulging with endplate degeneration. Bulky
uncovertebral spurring and advanced bilateral foraminal impingement

C6-7: Disc narrowing with uncovertebral ridging eccentric to the
left where there is advanced foraminal impingement. Moderate right
foraminal narrowing

C7-T1:Unremarkable.
IMPRESSION: Brain MRI:

No specific cause for symptoms.

Cervical MRI:

1. High-grade degenerative foraminal impingement bilaterally at
C4-5, bilaterally at C5-6, and on the left at C6-7.
2. Up to mild spinal stenosis at C4-5.  Normal cord signal.

## 2022-06-15 DIAGNOSIS — G1221 Amyotrophic lateral sclerosis: Secondary | ICD-10-CM | POA: Diagnosis not present

## 2022-07-02 DIAGNOSIS — G1221 Amyotrophic lateral sclerosis: Secondary | ICD-10-CM | POA: Diagnosis not present

## 2022-07-07 DIAGNOSIS — Z79899 Other long term (current) drug therapy: Secondary | ICD-10-CM | POA: Diagnosis not present

## 2022-07-07 DIAGNOSIS — G1221 Amyotrophic lateral sclerosis: Secondary | ICD-10-CM | POA: Diagnosis not present

## 2022-07-16 DIAGNOSIS — G1221 Amyotrophic lateral sclerosis: Secondary | ICD-10-CM | POA: Diagnosis not present

## 2022-07-27 DIAGNOSIS — G1221 Amyotrophic lateral sclerosis: Secondary | ICD-10-CM | POA: Diagnosis not present

## 2022-07-31 DIAGNOSIS — G1221 Amyotrophic lateral sclerosis: Secondary | ICD-10-CM | POA: Diagnosis not present

## 2022-08-14 DIAGNOSIS — G1221 Amyotrophic lateral sclerosis: Secondary | ICD-10-CM | POA: Diagnosis not present

## 2022-08-25 DIAGNOSIS — G1221 Amyotrophic lateral sclerosis: Secondary | ICD-10-CM | POA: Diagnosis not present

## 2022-08-31 DIAGNOSIS — G1221 Amyotrophic lateral sclerosis: Secondary | ICD-10-CM | POA: Diagnosis not present

## 2022-09-14 DIAGNOSIS — G1221 Amyotrophic lateral sclerosis: Secondary | ICD-10-CM | POA: Diagnosis not present

## 2022-09-25 DIAGNOSIS — G1221 Amyotrophic lateral sclerosis: Secondary | ICD-10-CM | POA: Diagnosis not present

## 2022-09-30 DIAGNOSIS — G1221 Amyotrophic lateral sclerosis: Secondary | ICD-10-CM | POA: Diagnosis not present

## 2022-10-06 DIAGNOSIS — Z79899 Other long term (current) drug therapy: Secondary | ICD-10-CM | POA: Diagnosis not present

## 2022-10-06 DIAGNOSIS — G1221 Amyotrophic lateral sclerosis: Secondary | ICD-10-CM | POA: Diagnosis not present

## 2022-10-06 DIAGNOSIS — K30 Functional dyspepsia: Secondary | ICD-10-CM | POA: Diagnosis not present

## 2022-10-14 DIAGNOSIS — G1221 Amyotrophic lateral sclerosis: Secondary | ICD-10-CM | POA: Diagnosis not present

## 2022-10-25 DIAGNOSIS — G1221 Amyotrophic lateral sclerosis: Secondary | ICD-10-CM | POA: Diagnosis not present

## 2022-10-31 DIAGNOSIS — G1221 Amyotrophic lateral sclerosis: Secondary | ICD-10-CM | POA: Diagnosis not present

## 2022-11-14 DIAGNOSIS — G1221 Amyotrophic lateral sclerosis: Secondary | ICD-10-CM | POA: Diagnosis not present

## 2022-11-25 DIAGNOSIS — G1221 Amyotrophic lateral sclerosis: Secondary | ICD-10-CM | POA: Diagnosis not present

## 2022-12-04 DIAGNOSIS — G1221 Amyotrophic lateral sclerosis: Secondary | ICD-10-CM | POA: Diagnosis not present

## 2022-12-25 DIAGNOSIS — G1221 Amyotrophic lateral sclerosis: Secondary | ICD-10-CM | POA: Diagnosis not present

## 2023-01-04 DIAGNOSIS — G1221 Amyotrophic lateral sclerosis: Secondary | ICD-10-CM | POA: Diagnosis not present

## 2023-01-12 DIAGNOSIS — G1221 Amyotrophic lateral sclerosis: Secondary | ICD-10-CM | POA: Diagnosis not present

## 2023-01-12 DIAGNOSIS — Z79899 Other long term (current) drug therapy: Secondary | ICD-10-CM | POA: Diagnosis not present

## 2023-01-12 DIAGNOSIS — Z9911 Dependence on respirator [ventilator] status: Secondary | ICD-10-CM | POA: Diagnosis not present

## 2023-01-25 DIAGNOSIS — G1221 Amyotrophic lateral sclerosis: Secondary | ICD-10-CM | POA: Diagnosis not present

## 2023-02-04 DIAGNOSIS — G1221 Amyotrophic lateral sclerosis: Secondary | ICD-10-CM | POA: Diagnosis not present

## 2023-02-19 DIAGNOSIS — G1221 Amyotrophic lateral sclerosis: Secondary | ICD-10-CM | POA: Diagnosis not present

## 2023-02-25 DIAGNOSIS — G1221 Amyotrophic lateral sclerosis: Secondary | ICD-10-CM | POA: Diagnosis not present

## 2023-03-21 DIAGNOSIS — G1221 Amyotrophic lateral sclerosis: Secondary | ICD-10-CM | POA: Diagnosis not present

## 2023-03-27 DIAGNOSIS — G1221 Amyotrophic lateral sclerosis: Secondary | ICD-10-CM | POA: Diagnosis not present

## 2023-03-31 ENCOUNTER — Other Ambulatory Visit (HOSPITAL_COMMUNITY): Payer: Self-pay

## 2023-04-20 DIAGNOSIS — G1221 Amyotrophic lateral sclerosis: Secondary | ICD-10-CM | POA: Diagnosis not present

## 2023-04-20 DIAGNOSIS — E639 Nutritional deficiency, unspecified: Secondary | ICD-10-CM | POA: Diagnosis not present

## 2023-04-20 DIAGNOSIS — Z7409 Other reduced mobility: Secondary | ICD-10-CM | POA: Diagnosis not present

## 2023-04-21 DIAGNOSIS — G1221 Amyotrophic lateral sclerosis: Secondary | ICD-10-CM | POA: Diagnosis not present

## 2023-04-27 DIAGNOSIS — G1221 Amyotrophic lateral sclerosis: Secondary | ICD-10-CM | POA: Diagnosis not present

## 2023-06-01 DEATH — deceased
